# Patient Record
Sex: Female | Born: 1960 | Race: White | Hispanic: No | Marital: Married | Smoking: Never smoker
Health system: Southern US, Community
[De-identification: ages and names within clinical notes are randomized; demographics above are authoritative.]

---

## 1999-09-26 ENCOUNTER — Emergency Department (HOSPITAL_COMMUNITY): Admission: EM | Admit: 1999-09-26 | Discharge: 1999-09-26 | Payer: Self-pay | Admitting: Emergency Medicine

## 1999-09-26 ENCOUNTER — Encounter: Payer: Self-pay | Admitting: Emergency Medicine

## 2000-03-24 ENCOUNTER — Emergency Department (HOSPITAL_COMMUNITY): Admission: EM | Admit: 2000-03-24 | Discharge: 2000-03-24 | Payer: Self-pay | Admitting: *Deleted

## 2000-05-08 ENCOUNTER — Encounter: Payer: Self-pay | Admitting: Emergency Medicine

## 2000-05-08 ENCOUNTER — Emergency Department (HOSPITAL_COMMUNITY): Admission: EM | Admit: 2000-05-08 | Discharge: 2000-05-08 | Payer: Self-pay | Admitting: Emergency Medicine

## 2000-05-19 ENCOUNTER — Emergency Department (HOSPITAL_COMMUNITY): Admission: EM | Admit: 2000-05-19 | Discharge: 2000-05-19 | Payer: Self-pay | Admitting: Emergency Medicine

## 2000-05-19 ENCOUNTER — Encounter: Payer: Self-pay | Admitting: Emergency Medicine

## 2002-11-10 ENCOUNTER — Emergency Department (HOSPITAL_COMMUNITY): Admission: EM | Admit: 2002-11-10 | Discharge: 2002-11-10 | Payer: Self-pay | Admitting: Emergency Medicine

## 2004-07-09 ENCOUNTER — Emergency Department (HOSPITAL_COMMUNITY): Admission: EM | Admit: 2004-07-09 | Discharge: 2004-07-09 | Payer: Self-pay | Admitting: Emergency Medicine

## 2005-07-02 ENCOUNTER — Emergency Department (HOSPITAL_COMMUNITY): Admission: EM | Admit: 2005-07-02 | Discharge: 2005-07-02 | Payer: Self-pay | Admitting: Emergency Medicine

## 2007-05-20 ENCOUNTER — Emergency Department (HOSPITAL_COMMUNITY): Admission: EM | Admit: 2007-05-20 | Discharge: 2007-05-20 | Payer: Self-pay | Admitting: Emergency Medicine

## 2008-06-05 ENCOUNTER — Emergency Department (HOSPITAL_COMMUNITY): Admission: EM | Admit: 2008-06-05 | Discharge: 2008-06-06 | Payer: Self-pay | Admitting: Emergency Medicine

## 2009-03-13 ENCOUNTER — Emergency Department (HOSPITAL_COMMUNITY): Admission: EM | Admit: 2009-03-13 | Discharge: 2009-03-13 | Payer: Self-pay | Admitting: Emergency Medicine

## 2009-07-10 ENCOUNTER — Ambulatory Visit: Payer: Self-pay | Admitting: Cardiology

## 2009-07-10 ENCOUNTER — Ambulatory Visit: Payer: Self-pay | Admitting: Internal Medicine

## 2009-07-10 ENCOUNTER — Observation Stay (HOSPITAL_COMMUNITY): Admission: EM | Admit: 2009-07-10 | Discharge: 2009-07-11 | Payer: Self-pay | Admitting: Emergency Medicine

## 2009-07-25 ENCOUNTER — Encounter (INDEPENDENT_AMBULATORY_CARE_PROVIDER_SITE_OTHER): Payer: Self-pay | Admitting: Internal Medicine

## 2009-07-25 ENCOUNTER — Ambulatory Visit: Payer: Self-pay | Admitting: Internal Medicine

## 2009-07-25 DIAGNOSIS — R05 Cough: Secondary | ICD-10-CM

## 2009-07-25 DIAGNOSIS — D509 Iron deficiency anemia, unspecified: Secondary | ICD-10-CM | POA: Insufficient documentation

## 2009-07-25 DIAGNOSIS — R079 Chest pain, unspecified: Secondary | ICD-10-CM | POA: Insufficient documentation

## 2011-01-04 LAB — CARDIAC PANEL(CRET KIN+CKTOT+MB+TROPI)
CK, MB: 0.4 ng/mL (ref 0.3–4.0)
Troponin I: 0.01 ng/mL (ref 0.00–0.06)

## 2011-01-04 LAB — COMPREHENSIVE METABOLIC PANEL
ALT: 11 U/L (ref 0–35)
BUN: 4 mg/dL — ABNORMAL LOW (ref 6–23)
Calcium: 8.7 mg/dL (ref 8.4–10.5)
Creatinine, Ser: 0.7 mg/dL (ref 0.4–1.2)
GFR calc non Af Amer: >60 mL/min (ref >60)
Glucose, Bld: 105 mg/dL — ABNORMAL HIGH (ref 70–99)
Sodium: 138 mEq/L (ref 135–145)
Total Protein: 7.5 g/dL (ref 6.0–8.3)

## 2011-01-04 LAB — POCT I-STAT, CHEM 8
HCT: 33 % — ABNORMAL LOW (ref 36.0–46.0)
Hemoglobin: 11.2 g/dL — ABNORMAL LOW (ref 12.0–15.0)
Potassium: 4.1 mEq/L (ref 3.5–5.1)
Sodium: 138 mEq/L (ref 135–145)

## 2011-01-04 LAB — CBC
Hemoglobin: 9 g/dL — ABNORMAL LOW (ref 12.0–15.0)
MCHC: 32 g/dL (ref 30.0–36.0)
Platelets: 290 10*3/uL (ref 150–400)
RBC: 4.32 MIL/uL (ref 3.87–5.11)
RDW: 17.1 % — ABNORMAL HIGH (ref 11.5–15.5)
RDW: 17.3 % — ABNORMAL HIGH (ref 11.5–15.5)
WBC: 6.9 10*3/uL (ref 4.0–10.5)

## 2011-01-04 LAB — LIPID PANEL
LDL Cholesterol: 89 mg/dL (ref 0–99)
Triglycerides: 75 mg/dL (ref <150)

## 2011-01-04 LAB — URINALYSIS, ROUTINE W REFLEX MICROSCOPIC
Glucose, UA: NEGATIVE mg/dL
Nitrite: POSITIVE — AB
Protein, ur: NEGATIVE mg/dL
pH: 7 (ref 5.0–8.0)

## 2011-01-04 LAB — RETICULOCYTES
RBC.: 4.5 MIL/uL (ref 3.87–5.11)
Retic Count, Absolute: 54 10*3/uL (ref 19.0–186.0)
Retic Ct Pct: 1.2 % (ref 0.4–3.1)

## 2011-01-04 LAB — CK TOTAL AND CKMB (NOT AT ARMC)
Relative Index: INVALID (ref 0.0–2.5)
Total CK: 39 U/L (ref 7–177)

## 2011-01-04 LAB — POCT CARDIAC MARKERS
CKMB, poc: <1 ng/mL — ABNORMAL LOW (ref 1.0–8.0)
CKMB, poc: <1 ng/mL — ABNORMAL LOW (ref 1.0–8.0)
Myoglobin, poc: 46.4 ng/mL (ref 12–200)

## 2011-01-04 LAB — FERRITIN: Ferritin: 4 ng/mL — ABNORMAL LOW (ref 10–291)

## 2011-01-04 LAB — DIFFERENTIAL
Basophils Absolute: 0.1 10*3/uL (ref 0.0–0.1)
Basophils Relative: 1 % (ref 0–1)
Neutro Abs: 5.9 10*3/uL (ref 1.7–7.7)
Neutrophils Relative %: 71 % (ref 43–77)

## 2011-01-04 LAB — TSH: TSH: 0.949 u[IU]/mL (ref 0.350–4.500)

## 2011-01-04 LAB — BASIC METABOLIC PANEL
BUN: 4 mg/dL — ABNORMAL LOW (ref 6–23)
CO2: 25 mEq/L (ref 19–32)
Chloride: 104 mEq/L (ref 96–112)
Creatinine, Ser: 0.78 mg/dL (ref 0.4–1.2)
Potassium: 3.6 mEq/L (ref 3.5–5.1)

## 2011-01-04 LAB — RAPID URINE DRUG SCREEN, HOSP PERFORMED
Amphetamines: NOT DETECTED
Barbiturates: NOT DETECTED

## 2011-01-04 LAB — IRON AND TIBC
Iron: 17 ug/dL — ABNORMAL LOW (ref 42–135)
Saturation Ratios: 5 % — ABNORMAL LOW (ref 20–55)
TIBC: 347 ug/dL (ref 250–470)
UIBC: 330 ug/dL

## 2011-01-04 LAB — VITAMIN B12: Vitamin B-12: 395 pg/mL (ref 211–911)

## 2011-01-04 LAB — URINE MICROSCOPIC-ADD ON

## 2011-01-04 LAB — TROPONIN I: Troponin I: 0.01 ng/mL (ref 0.00–0.06)

## 2011-06-24 ENCOUNTER — Emergency Department (HOSPITAL_COMMUNITY)
Admission: EM | Admit: 2011-06-24 | Discharge: 2011-06-24 | Disposition: A | Discharge status: Home / Self Care | Payer: Self-pay | Attending: Emergency Medicine | Admitting: Emergency Medicine

## 2011-06-24 ENCOUNTER — Emergency Department (HOSPITAL_COMMUNITY): Payer: Self-pay

## 2011-06-24 DIAGNOSIS — R062 Wheezing: Secondary | ICD-10-CM | POA: Insufficient documentation

## 2011-06-24 DIAGNOSIS — R05 Cough: Secondary | ICD-10-CM | POA: Insufficient documentation

## 2011-06-24 DIAGNOSIS — R059 Cough, unspecified: Secondary | ICD-10-CM | POA: Insufficient documentation

## 2012-02-07 ENCOUNTER — Emergency Department (HOSPITAL_COMMUNITY)
Admission: EM | Admit: 2012-02-07 | Discharge: 2012-02-07 | Disposition: A | Discharge status: Home / Self Care | Payer: Self-pay | Attending: Emergency Medicine | Admitting: Emergency Medicine

## 2012-02-07 ENCOUNTER — Encounter (HOSPITAL_COMMUNITY): Payer: Self-pay | Admitting: Family Medicine

## 2012-02-07 DIAGNOSIS — M542 Cervicalgia: Secondary | ICD-10-CM | POA: Insufficient documentation

## 2012-02-07 DIAGNOSIS — M25519 Pain in unspecified shoulder: Secondary | ICD-10-CM | POA: Insufficient documentation

## 2012-02-07 DIAGNOSIS — M62838 Other muscle spasm: Secondary | ICD-10-CM | POA: Insufficient documentation

## 2012-02-07 MED ORDER — KETOROLAC TROMETHAMINE 30 MG/ML IJ SOLN
30.0000 mg | Freq: Once | INTRAMUSCULAR | Status: AC
  Administered 2012-02-07: 30 mg via INTRAMUSCULAR
  Filled 2012-02-07: qty 1

## 2012-02-07 MED ORDER — DIAZEPAM 2 MG PO TABS
2.0000 mg | ORAL_TABLET | Freq: Once | ORAL | Status: AC
  Administered 2012-02-07: 2 mg via ORAL
  Filled 2012-02-07: qty 1

## 2012-02-07 MED ORDER — IBUPROFEN 600 MG PO TABS: 600.0000 mg | ORAL_TABLET | Freq: Four times a day (QID) | ORAL | Status: AC | PRN

## 2012-02-07 MED ORDER — DIAZEPAM 2 MG PO TABS: 2.0000 mg | ORAL_TABLET | Freq: Three times a day (TID) | ORAL | Status: AC | PRN

## 2012-02-07 NOTE — ED Notes (Signed)
Patient states that she has had neck pain x 3 days. States pain is "getting sharper". States neck pain moves up her neck and gives her a headache. Denies injury.

## 2012-02-07 NOTE — ED Provider Notes (Signed)
History     CSN: [id number]  Arrival date & time 02/07/12  0130   First MD Initiated Contact with Patient 02/07/12 0355      Chief Complaint  Patient presents with  . Neck Pain    (Consider location/radiation/quality/duration/timing/severity/associated sxs/prior treatment) HPI Comments: Is a left-sided neck pain for the past 3 days.  She is taking Tylenol without relief.  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  Denies sleeping with a fan her having a draft on her neck  Patient is a 51 y.o. female presenting with neck pain. The history is provided by the patient.  Neck Pain  This is a new problem. The current episode started more than 2 days ago. The problem occurs constantly. The problem has not changed since onset.The pain is associated with nothing. There has been no fever. The pain is present in the left side. The quality of the pain is described as aching. The pain radiates to the left shoulder. The pain is at a severity of 4/10. The pain is moderate. The symptoms are aggravated by position. Pertinent negatives include no chest pain, no headaches and no weakness.    History reviewed. No pertinent past medical history.  History reviewed. No pertinent past surgical history.  No family history on file.  History  Substance Use Topics  . Smoking status: Never Smoker   . Smokeless tobacco: Not on file  . Alcohol Use: No    OB History    Grav Para Term Preterm Abortions TAB SAB Ect Mult Living                  Review of Systems  Constitutional: Negative for fever and chills.  HENT: Positive for neck pain. Negative for congestion and rhinorrhea.   Respiratory: Negative for cough and shortness of breath.   Cardiovascular: Negative for chest pain and palpitations.  Musculoskeletal: Negative for myalgias and back pain.  Skin: Negative for rash.  Neurological: Negative for dizziness, weakness and headaches.    Allergies  Review  of patient's allergies indicates no known allergies.  Home Medications   Current Outpatient Rx  Name Route Sig Dispense Refill  . ASPIRIN-CAFFEINE 845-65 MG PO PACK Oral Take by mouth.    Marland Kitchen DIAZEPAM 2 MG PO TABS Oral Take 1 tablet (2 mg total) by mouth every 8 (eight) hours as needed for anxiety. 20 tablet 0  . IBUPROFEN 600 MG PO TABS Oral Take 1 tablet (600 mg total) by mouth every 6 (six) hours as needed for pain. 30 tablet 0    BP 145/83  Pulse 84  Temp(Src) 97.9 F (36.6 C) (Oral)  Resp 18  SpO2 98%  Physical Exam  Constitutional: She is oriented to person, place, and time. She appears well-developed and well-nourished.  HENT:  Head: Normocephalic.  Right Ear: Tympanic membrane, external ear and ear canal normal.  Left Ear: Tympanic membrane, external ear and ear canal normal.  Neck:    Cardiovascular: Normal rate.   Pulmonary/Chest: Effort normal.  Musculoskeletal: She exhibits no edema and no tenderness.  Neurological: She is alert and oriented to person, place, and time.  Skin: Skin is warm.    ED Course  Procedures (including critical care time)  Labs Reviewed - No data to display No results found.   1. Muscle spasms of head or neck    Patient has significant relief of her discomfort after by mouth Valium IM Toradol and warm compresses  MDM  Muscle spasm We'll treat with low-dose Valium, heat, and ibuprofen/Toradol       Arman Filter, NP 02/07/12 1610  Arman Filter, NP 02/07/12 [phone]

## 2012-02-08 NOTE — ED Provider Notes (Signed)
Medical screening examination/treatment/procedure(s) were performed by non-physician practitioner and as supervising physician I was immediately available for consultation/collaboration.  Jalaine Riggenbach, MD 02/08/12 0127 

## 2012-02-25 ENCOUNTER — Emergency Department (HOSPITAL_COMMUNITY)
Admission: EM | Admit: 2012-02-25 | Discharge: 2012-02-25 | Disposition: A | Discharge status: Home / Self Care | Payer: Self-pay | Attending: Emergency Medicine | Admitting: Emergency Medicine

## 2012-02-25 ENCOUNTER — Encounter (HOSPITAL_COMMUNITY): Payer: Self-pay | Admitting: *Deleted

## 2012-02-25 DIAGNOSIS — M549 Dorsalgia, unspecified: Secondary | ICD-10-CM

## 2012-02-25 DIAGNOSIS — M545 Low back pain, unspecified: Secondary | ICD-10-CM | POA: Insufficient documentation

## 2012-02-25 LAB — URINALYSIS, ROUTINE W REFLEX MICROSCOPIC
Leukocytes, UA: NEGATIVE
Nitrite: NEGATIVE
Specific Gravity, Urine: 1.019 (ref 1.005–1.030)
pH: 7.5 (ref 5.0–8.0)

## 2012-02-25 LAB — POCT PREGNANCY, URINE: Preg Test, Ur: NEGATIVE

## 2012-02-25 MED ORDER — OXYCODONE-ACETAMINOPHEN 5-325 MG PO TABS: 1.0000 | ORAL_TABLET | ORAL | Status: AC | PRN

## 2012-02-25 MED ORDER — CYCLOBENZAPRINE HCL 10 MG PO TABS: 10.0000 mg | ORAL_TABLET | Freq: Two times a day (BID) | ORAL | Status: AC | PRN

## 2012-02-25 MED ORDER — SULFAMETHOXAZOLE-TRIMETHOPRIM 800-160 MG PO TABS: 1.0000 | ORAL_TABLET | Freq: Two times a day (BID) | ORAL | Status: DC

## 2012-02-25 MED ORDER — IBUPROFEN 800 MG PO TABS: 800.0000 mg | ORAL_TABLET | Freq: Three times a day (TID) | ORAL | Status: AC

## 2012-02-25 MED ORDER — IBUPROFEN 800 MG PO TABS
800.0000 mg | ORAL_TABLET | Freq: Once | ORAL | Status: AC
  Administered 2012-02-25: 800 mg via ORAL
  Filled 2012-02-25: qty 1

## 2012-02-25 NOTE — Discharge Instructions (Signed)
Courtney Howe you do not have a UTI today (the pain over your R kidney).  I think this is a skilled skeletal back pain. Take ibuprofen 800 mg every 6 hours with food x24 hours. You can take a muscle relaxer and Percocet for severe pain but do not drive with this medication. Return to the ER for bowel or bladder problems or if you develop severe pain and blood in your urine.  Follow up with a pcp from the list below.  RESOURCE GUIDE  Dental Problems  Patients with Medicaid: [facility] [phone] [address]                                           [phone] [address] Phone:  [phone]                                                  Phone:  [phone]  If unable to pay or uninsured, contact:  [facility] or [facility]. to become qualified for the adult dental clinic.  Chronic Pain Problems Contact [facility]  [phone] Patients need to be referred by their primary care doctor.  Insufficient Money for Medicine Contact [employer]:  call "211" or [facility] [phone].  No Primary Care Doctor Call [facility]  646-820-8306 Other agencies that provide inexpensive medical care    [facility]  [phone]    [facility]  [phone]    [facility]  [phone]    [facility]  [phone]    [facility]  [phone]    [facility]  4782799401  Psychological Services [facility] Behavioral [facility]  [phone] [facility] Services  [phone] [facility] Mental [facility]   [phone] (emergency services [phone])  Substance Abuse Resources [facility]  [phone] [facility] [phone] [facility] [phone] Floydene Flock [phone] Residential & Outpatient Substance Abuse Program  [phone]  Abuse/Neglect [facility] Child Abuse Hotline 778-237-4098 [facility] Child Abuse Hotline [phone] (After  Hours)  Emergency Shelter [facility] [phone]  Maternity Homes [facility] [phone] [facility] 9088687980  MRSA Hotline #:   204-128-4913    [facility] Resources  [facility]     [employer]                          [facility] Dept. [address] [place]                       [address]      [address]  Courtney Howe  Courtney Howe Phone:  [phone]                                   Phone:  [phone]                 Phone:  [phone]  [facility] Phone:  [phone]  [facility] Child Abuse Hotline 717-097-8637 670-550-1999 (After Hours)   Back Pain, Adult Back pain is very common. The pain often gets better over time. The cause of back pain is usually not dangerous. Most people can learn to manage their back pain on their own.  HOME CARE   Stay active. Start with short walks on flat ground if you can. Try to walk farther each day.   Do not sit, drive, or stand in one place for more than 30 minutes. Do not stay in bed.   Do not avoid exercise or work. Activity can help your back heal faster.   Be careful when you bend or lift an object. Bend at your knees, keep the object close to you, and do not twist.   Sleep on a firm mattress. Lie on your side, and bend your knees. If you lie on your back, put a pillow under your knees.   Only take medicines as told by your doctor.   Put ice on the injured area.   Put ice in a plastic bag.   Place a towel between your skin and the bag.   Leave the ice on for 15 to 20 minutes, 3 to 4 times a day for the first 2 to 3 days. After that, you can switch between ice and heat packs.   Ask your doctor about back exercises or massage.   Avoid feeling anxious or stressed. Find good ways to deal with stress, such as  exercise.  GET HELP RIGHT AWAY IF:   Your pain does not go away with rest or medicine.   Your pain does not go away in 1 week.   You have new problems.   You do not feel well.   The pain spreads into your legs.   You cannot control when you poop (bowel movement) or pee (urinate).   Your arms or legs feel weak or lose feeling (numbness).   You feel sick to your stomach (nauseous) or throw up (vomit).   You have belly (abdominal) pain.   You feel like you may pass out (faint).  MAKE SURE YOU:   Understand these instructions.   Will watch your condition.   Will get help right away if you are not doing well or get worse.  Document Released: 03/05/2008 Document Revised: 09/06/2011 Document Reviewed: 02/05/2011 [facility] Patient Information 2012 [place], [place].

## 2012-02-25 NOTE — ED Provider Notes (Signed)
History     CSN: [id number]  Arrival date & time 02/25/12  1103   First MD Initiated Contact with Patient 02/25/12 1138      Chief Complaint  Patient presents with  . Back Pain    (Consider location/radiation/quality/duration/timing/severity/associated sxs/prior treatment) Patient is a 51 y.o. female presenting with back pain. The history is provided by the patient. No language interpreter was used.  Back Pain  This is a new problem. The current episode started 2 days ago. The problem occurs constantly. The problem has been gradually worsening. The pain is associated with no known injury. The quality of the pain is described as aching. The pain does not radiate. The pain is at a severity of 9/10. The pain is moderate. The symptoms are aggravated by bending, twisting and certain positions. The pain is the same all the time. Pertinent negatives include no fever, no numbness, no headaches, no bowel incontinence, no perianal numbness, no bladder incontinence, no dysuria, no leg pain, no paresthesias, no paresis, no tingling and no weakness. She has tried NSAIDs for the symptoms. The treatment provided mild relief.   Patient here today complaining of right lower back pain since Saturday. 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 She denies injury. No radiation of pain to the right leg. No bowel or bladder problems. No fever. Patient has taken 2 doses of 600 of ibuprofen with an some relief. No nausea vomiting. Past medical history none.  History reviewed. No pertinent past medical history.  History reviewed. No pertinent past surgical history.  History reviewed. No pertinent family history.  History  Substance Use Topics  . Smoking status: Never Smoker   . Smokeless tobacco: Not on file  . Alcohol Use: Yes    OB History    Grav Para Term Preterm Abortions TAB SAB Ect Mult Living                  Review of Systems   Constitutional: Negative.  Negative for fever.  HENT: Negative.   Eyes: Negative.   Respiratory: Negative.   Cardiovascular: Negative.   Gastrointestinal: Negative.  Negative for bowel incontinence.  Genitourinary: Negative for bladder incontinence and dysuria.  Musculoskeletal: Positive for back pain. Negative for gait problem.  Neurological: Negative.  Negative for dizziness, tingling, weakness, numbness, headaches and paresthesias.  Psychiatric/Behavioral: Negative.   All other systems reviewed and are negative.    Allergies  Review of patient's allergies indicates no known allergies.  Home Medications   Current Outpatient Rx  Name Route Sig Dispense Refill  . IBUPROFEN 600 MG PO TABS Oral Take 600 mg by mouth every 6 (six) hours as needed. For pain/fever      BP 136/69  Pulse 65  Temp(Src) 97.7 F (36.5 C) (Oral)  Resp 18  SpO2 97%  Physical Exam  Nursing note and vitals reviewed. Constitutional: She is oriented to person, place, and time. She appears well-developed and well-nourished.  HENT:  Head: Normocephalic and atraumatic.  Eyes: Conjunctivae and EOM are normal. Pupils are equal, round, and reactive to light.  Neck: Normal range of motion. Neck supple.  Cardiovascular: Normal rate.   Pulmonary/Chest: Effort normal.  Abdominal: Soft.  Musculoskeletal: Normal range of motion. She exhibits tenderness. She exhibits no edema.       R Lower back  Neurological: She is alert and oriented to person, place, and time. She has  normal reflexes.  Skin: Skin is warm and dry.  Psychiatric: She has a normal mood and affect.    ED Course  Procedures (including critical care time)   Labs Reviewed  URINALYSIS, ROUTINE W REFLEX MICROSCOPIC   No results found.   No diagnosis found.    MDM  R lower back pain with no radiation.  No bowel or bladder problems no fever.  Treated with ibuprofen in the ER.  Rx for flexoril.  Instructed to use ice as well.  Follow up with  pcp from list.  Return if worse.        Remi Haggard, NP 02/25/12 2122

## 2012-02-25 NOTE — ED Provider Notes (Signed)
Medical screening examination/treatment/procedure(s) were performed by non-physician practitioner and as supervising physician I was immediately available for consultation/collaboration.   Gwyneth Sprout, MD 02/25/12 [phone]

## 2012-02-25 NOTE — ED Notes (Signed)
C/o lower back pain onset Sat. Denies injury. States the pain is getting worse.

## 2012-07-19 ENCOUNTER — Emergency Department (HOSPITAL_COMMUNITY)
Admission: EM | Admit: 2012-07-19 | Discharge: 2012-07-20 | Disposition: A | Discharge status: Home / Self Care | Payer: Self-pay | Attending: Emergency Medicine | Admitting: Emergency Medicine

## 2012-07-19 ENCOUNTER — Encounter (HOSPITAL_COMMUNITY): Payer: Self-pay

## 2012-07-19 DIAGNOSIS — M25561 Pain in right knee: Secondary | ICD-10-CM

## 2012-07-19 DIAGNOSIS — R609 Edema, unspecified: Secondary | ICD-10-CM | POA: Insufficient documentation

## 2012-07-19 DIAGNOSIS — M25569 Pain in unspecified knee: Secondary | ICD-10-CM | POA: Insufficient documentation

## 2012-07-19 NOTE — ED Notes (Signed)
pt reports (R) knee pain and swelling x2 weeks, pt reports increase pain w/movement, pt denies injury

## 2012-07-20 ENCOUNTER — Emergency Department (HOSPITAL_COMMUNITY): Payer: Self-pay

## 2012-07-20 MED ORDER — HYDROCODONE-ACETAMINOPHEN 5-325 MG PO TABS: 1.0000 | ORAL_TABLET | ORAL | Status: DC | PRN

## 2012-07-20 NOTE — ED Notes (Signed)
Patient currently resting quietly in bed; no respiratory or acute distress noted.  Patient updated on plan of care; informed patient that we are currently waiting on further orders from EDP/PA.  Patient has no other questions or concerns at this time; will continue to monitor. 

## 2012-07-20 NOTE — ED Notes (Signed)
Patient complaining of right knee pain and swelling that started two weeks ago.  Rates pain 8/10 on the numerical pain scale; describes pain as a "sorness".  Pain worsens upon standing and ambulating.  Patient denies any recent injury to right knee. Ambulatory from triage to room. Patient alert and oriented x4; PERRL present.  Will continue to monitor.

## 2012-07-20 NOTE — ED Notes (Signed)
Paged ortho for knee immobilizer and crutches (patient states that she is 5'3").  Ortho is on way.

## 2012-07-20 NOTE — ED Notes (Signed)
Patient currently resting quietly in bed; no respiratory or acute distress noted. Patient updated on plan of care; informed patient that we are currently waiting for ortho to come and apply knee immobilizer and give patient crutches.  Denies any needs at this time; will continue to monitor.
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
## 2012-07-20 NOTE — ED Provider Notes (Signed)
Medical screening examination/treatment/procedure(s) were performed by non-physician practitioner and as supervising physician I was immediately available for consultation/collaboration.   Trenise Turay, MD 07/20/12 0753 

## 2012-07-20 NOTE — ED Notes (Signed)
Patient given discharge instructions; went over discharge paperwork with patient.  Patient instructed to take prescriptions as directed, to not drive or drink while taking pain medication, to follow up with referral, and to return to the ED for new, worsening, or concerning symptoms.

## 2014-06-23 ENCOUNTER — Encounter (HOSPITAL_COMMUNITY): Payer: Self-pay | Admitting: Emergency Medicine

## 2014-06-23 ENCOUNTER — Emergency Department (HOSPITAL_COMMUNITY)
Admission: EM | Admit: 2014-06-23 | Discharge: 2014-06-24 | Disposition: A | Discharge status: Home / Self Care | Payer: Self-pay | Attending: Emergency Medicine | Admitting: Emergency Medicine

## 2014-06-23 DIAGNOSIS — Z3202 Encounter for pregnancy test, result negative: Secondary | ICD-10-CM | POA: Insufficient documentation

## 2014-06-23 DIAGNOSIS — R109 Unspecified abdominal pain: Secondary | ICD-10-CM | POA: Insufficient documentation

## 2014-06-23 DIAGNOSIS — N390 Urinary tract infection, site not specified: Secondary | ICD-10-CM | POA: Insufficient documentation

## 2014-06-23 DIAGNOSIS — R1031 Right lower quadrant pain: Secondary | ICD-10-CM | POA: Insufficient documentation

## 2014-06-23 LAB — BASIC METABOLIC PANEL
Anion gap: 15 (ref 5–15)
BUN: 9 mg/dL (ref 6–23)
CHLORIDE: 95 meq/L — AB (ref 96–112)
CO2: 24 mEq/L (ref 19–32)
CREATININE: 0.74 mg/dL (ref 0.50–1.10)
Calcium: 9.4 mg/dL (ref 8.4–10.5)
GFR calc Af Amer: >90 mL/min (ref >90)
GFR calc non Af Amer: >90 mL/min (ref >90)
GLUCOSE: 111 mg/dL — AB (ref 70–99)
POTASSIUM: 4.2 meq/L (ref 3.7–5.3)
Sodium: 134 mEq/L — ABNORMAL LOW (ref 137–147)

## 2014-06-23 LAB — CBC WITH DIFFERENTIAL/PLATELET
BASOS PCT: 0 % (ref 0–1)
Basophils Absolute: 0 10*3/uL (ref 0.0–0.1)
EOS ABS: 0.1 10*3/uL (ref 0.0–0.7)
Eosinophils Relative: 0 % (ref 0–5)
HEMATOCRIT: 41.9 % (ref 36.0–46.0)
HEMOGLOBIN: 14.6 g/dL (ref 12.0–15.0)
LYMPHS ABS: 2.5 10*3/uL (ref 0.7–4.0)
Lymphocytes Relative: 17 % (ref 12–46)
MCH: 31.8 pg (ref 26.0–34.0)
MCHC: 34.8 g/dL (ref 30.0–36.0)
MCV: 91.3 fL (ref 78.0–100.0)
MONO ABS: 0.9 10*3/uL (ref 0.1–1.0)
MONOS PCT: 6 % (ref 3–12)
NEUTROS ABS: 11.9 10*3/uL — AB (ref 1.7–7.7)
NEUTROS PCT: 77 % (ref 43–77)
Platelets: 301 10*3/uL (ref 150–400)
RBC: 4.59 MIL/uL (ref 3.87–5.11)
RDW: 12.3 % (ref 11.5–15.5)
WBC: 15.4 10*3/uL — ABNORMAL HIGH (ref 4.0–10.5)

## 2014-06-23 LAB — URINALYSIS, ROUTINE W REFLEX MICROSCOPIC
BILIRUBIN URINE: NEGATIVE
GLUCOSE, UA: NEGATIVE mg/dL
KETONES UR: NEGATIVE mg/dL
NITRITE: POSITIVE — AB
PH: 5.5 (ref 5.0–8.0)
Protein, ur: 30 mg/dL — AB
SPECIFIC GRAVITY, URINE: 1.013 (ref 1.005–1.030)
Urobilinogen, UA: 1 mg/dL (ref 0.0–1.0)

## 2014-06-23 LAB — URINE MICROSCOPIC-ADD ON

## 2014-06-23 MED ORDER — CEFTRIAXONE SODIUM 1 G IJ SOLR
1.0000 g | Freq: Once | INTRAMUSCULAR | Status: AC
  Administered 2014-06-23: 1 g via INTRAVENOUS
  Filled 2014-06-23: qty 10

## 2014-06-23 MED ORDER — KETOROLAC TROMETHAMINE 30 MG/ML IJ SOLN
30.0000 mg | Freq: Once | INTRAMUSCULAR | Status: AC
  Administered 2014-06-23: 30 mg via INTRAVENOUS
  Filled 2014-06-23: qty 1

## 2014-06-23 MED ORDER — SODIUM CHLORIDE 0.9 % IV SOLN
INTRAVENOUS | Status: DC
  Administered 2014-06-23 – 2014-06-24 (×2): via INTRAVENOUS

## 2014-06-23 NOTE — ED Provider Notes (Signed)
CSN: [id number]     Arrival date & time 06/23/14  2043 History   First MD Initiated Contact with Patient 06/23/14 2155     Chief Complaint  Patient presents with  . Flank Pain     (Consider location/radiation/quality/duration/timing/severity/associated sxs/prior Treatment) HPI Patient reports yesterday she started urinating small amounts frequently with some dysuria. She states today she started having lower abdominal discomfort and low back pain with some right-sided flank pain. She denies nausea, vomiting, or fevers. She denies hematuria. She states urinating, walking, and standing up straight makes the pain worse, nothing makes it feel better. She states she had similar symptoms when she had a UTI 20 years ago after the birth of her son. She denies any vaginal discharge. She states she gets a sharp shooting pain that only lasted a few seconds in her lower abdomen. Her last normal period was 3 years ago. She has never had any abdominal surgeries in the past.  PCP none  History reviewed. No pertinent past medical history. History reviewed. No pertinent past surgical history. Family History  Problem Relation Age of Onset  . Cancer Father    History  Substance Use Topics  . Smoking status: Never Smoker   . Smokeless tobacco: Not on file  . Alcohol Use: Yes     Comment: occ   Employed as a cook  OB History   Grav Para Term Preterm Abortions TAB SAB Ect Mult Living                 Review of Systems  All other systems reviewed and are negative.     Allergies  Review of patient's allergies indicates no known allergies.  Home Medications   Prior to Admission medications   Medication Sig Start Date End Date Taking? Authorizing Provider  acetaminophen (TYLENOL) 325 MG tablet Take 650 mg by mouth every 6 (six) hours as needed. For headache pain or fever   Yes Historical Provider, MD   BP 151/94  Pulse 93  Temp(Src) 98.6 F (37 C) (Oral)  Resp 18  Ht  (1.6 m)  Wt  150 lb (68.04 kg)  BMI 26.58 kg/m2  SpO2 99%  Vital signs normal   Physical Exam  Nursing note and vitals reviewed. Constitutional: She is oriented to person, place, and time. She appears well-developed and well-nourished.  Non-toxic appearance. She does not appear ill. No distress.  HENT:  Head: Normocephalic and atraumatic.  Right Ear: External ear normal.  Left Ear: External ear normal.  Nose: Nose normal. No mucosal edema or rhinorrhea.  Mouth/Throat: Oropharynx is clear and moist and mucous membranes are normal. No dental abscesses or uvula swelling.  Eyes: Conjunctivae and EOM are normal. Pupils are equal, round, and reactive to light.  Neck: Normal range of motion and full passive range of motion without pain. Neck supple.  Cardiovascular: Normal rate, regular rhythm and normal heart sounds.  Exam reveals no gallop and no friction rub.   No murmur heard. Pulmonary/Chest: Effort normal and breath sounds normal. No respiratory distress. She has no wheezes. She has no rhonchi. She has no rales. She exhibits no tenderness and no crepitus.  Abdominal: Soft. Normal appearance and bowel sounds are normal. She exhibits no distension. There is tenderness. There is no rebound and no guarding.    Musculoskeletal: Normal range of motion. She exhibits no edema and no tenderness.       Back:  Moves all extremities well. Patient has some right CVA tenderness  Neurological: She is alert and oriented to person, place, and time. She has normal strength. No cranial nerve deficit.  Skin: Skin is warm, dry and intact. No rash noted. No erythema. No pallor.  Psychiatric: She has a normal mood and affect. Her speech is normal and behavior is normal. Her mood appears not anxious.    ED Course  Procedures (including critical care time)  Medications  0.9 %  sodium chloride infusion ( Intravenous New Bag/Given 06/23/14 2239)  ketorolac (TORADOL) 30 MG/ML injection 30 mg (30 mg Intravenous Given  06/23/14 2239)  cefTRIAXone (ROCEPHIN) 1 g in dextrose 5 % 50 mL IVPB (0 g Intravenous Stopped 06/23/14 2331)   PT was started on IV rocephin for her UTI  Recheck at 2330 patient states her pain is improved. However she still very tender to palpation in her right lower quadrant and along her right upper abdomen. At this point we discussed getting CT abdomen to look for pyelonephritis, atypical appendicitis and she is agreeable.  2400 patient turned over to Dr. Romeo Apple to get her CT report. Hopefully she has a urinary tract infection and can be discharged home with antibiotics.  Labs Review Results for orders placed during the hospital encounter of 06/23/14  URINALYSIS, ROUTINE W REFLEX MICROSCOPIC      Result Value Ref Range   Color, Urine YELLOW  YELLOW   APPearance TURBID (*) CLEAR   Specific Gravity, Urine 1.013  1.005 - 1.030   pH 5.5  5.0 - 8.0   Glucose, UA NEGATIVE  NEGATIVE mg/dL   Hgb urine dipstick MODERATE (*) NEGATIVE   Bilirubin Urine NEGATIVE  NEGATIVE   Ketones, ur NEGATIVE  NEGATIVE mg/dL   Protein, ur 30 (*) NEGATIVE mg/dL   Urobilinogen, UA 1.0  0.0 - 1.0 mg/dL   Nitrite POSITIVE (*) NEGATIVE   Leukocytes, UA LARGE (*) NEGATIVE  CBC WITH DIFFERENTIAL      Result Value Ref Range   WBC 15.4 (*) 4.0 - 10.5 K/uL   RBC 4.59  3.87 - 5.11 MIL/uL   Hemoglobin 14.6  12.0 - 15.0 g/dL   HCT 45.4  09.8 - 11.9 %   MCV 91.3  78.0 - 100.0 fL   MCH 31.8  26.0 - 34.0 pg   MCHC 34.8  30.0 - 36.0 g/dL   RDW 14.7  82.9 - 56.2 %   Platelets 301  150 - 400 K/uL   Neutrophils Relative % 77  43 - 77 %   Neutro Abs 11.9 (*) 1.7 - 7.7 K/uL   Lymphocytes Relative 17  12 - 46 %   Lymphs Abs 2.5  0.7 - 4.0 K/uL   Monocytes Relative 6  3 - 12 %   Monocytes Absolute 0.9  0.1 - 1.0 K/uL   Eosinophils Relative 0  0 - 5 %   Eosinophils Absolute 0.1  0.0 - 0.7 K/uL   Basophils Relative 0  0 - 1 %   Basophils Absolute 0.0  0.0 - 0.1 K/uL  BASIC METABOLIC PANEL      Result Value Ref Range     Sodium 134 (*) 137 - 147 mEq/L   Potassium 4.2  3.7 - 5.3 mEq/L   Chloride 95 (*) 96 - 112 mEq/L   CO2 24  19 - 32 mEq/L   Glucose, Bld 111 (*) 70 - 99 mg/dL   BUN 9  6 - 23 mg/dL   Creatinine, Ser 1.30  0.50 - 1.10 mg/dL   Calcium 9.4  8.4 - 10.5 mg/dL   GFR calc non Af Amer >90  >90 mL/min   GFR calc Af Amer >90  >90 mL/min   Anion gap 15  5 - 15  URINE MICROSCOPIC-ADD ON      Result Value Ref Range   Squamous Epithelial / LPF RARE  RARE   WBC, UA TOO NUMEROUS TO COUNT  <3 WBC/hpf   RBC / HPF 3-6  <3 RBC/hpf   Bacteria, UA FEW (*) RARE   Laboratory interpretation all normal except UTI, leukocytosis     Imaging Review No results found.   EKG Interpretation None      MDM   Final diagnoses:  Urinary tract infection without hematuria, site unspecified  RLQ abdominal pain    Disposition pending   Courtney Albe, MD, Armando Gang     Ward Givens, MD 06/23/14 [phone]

## 2014-06-23 NOTE — ED Notes (Signed)
Pt states she is having pain in her right lower quadrant and the right side of her lower back  Pt states pain started today  Pt states she voids she has a lot of pressure and some burning  Pt states she is only voiding small amts at a time

## 2014-06-23 NOTE — ED Notes (Signed)
Urine Culture requisition sent to lab, lab informed us there was not enough urine for the culture. Will recollect specimen.

## 2014-06-24 ENCOUNTER — Emergency Department (HOSPITAL_COMMUNITY): Payer: Self-pay

## 2014-06-24 ENCOUNTER — Encounter (HOSPITAL_COMMUNITY): Payer: Self-pay

## 2014-06-24 LAB — PREGNANCY, URINE: PREG TEST UR: NEGATIVE

## 2014-06-24 MED ORDER — CIPROFLOXACIN HCL 500 MG PO TABS: 500.0000 mg | ORAL_TABLET | Freq: Two times a day (BID) | ORAL | Status: DC

## 2014-06-24 MED ORDER — IOHEXOL 300 MG/ML  SOLN
50.0000 mL | Freq: Once | INTRAMUSCULAR | Status: AC | PRN
  Administered 2014-06-24: 50 mL via ORAL

## 2014-06-24 MED ORDER — IOHEXOL 300 MG/ML  SOLN
100.0000 mL | Freq: Once | INTRAMUSCULAR | Status: AC | PRN
  Administered 2014-06-24: 100 mL via INTRAVENOUS

## 2014-06-24 MED ORDER — ONDANSETRON 4 MG PO TBDP: ORAL_TABLET | ORAL | Status: DC

## 2014-06-24 MED ORDER — OXYCODONE-ACETAMINOPHEN 5-325 MG PO TABS: 1.0000 | ORAL_TABLET | Freq: Four times a day (QID) | ORAL | Status: DC | PRN

## 2014-06-24 NOTE — ED Provider Notes (Signed)
12:07 AM Accepted care from Dr. Lynelle Doctor. 36F here w/ flank pain. Found to have UTI. CT ordered to r/o underlying appy or acute surgical process.   4:17 AM: CT w/ cystitis and ureteritis. Will send home on cipro. Urine cx has been sent. Pt continues to appear well, no vomiting, minimal pain.  I have discussed the diagnosis/risks/treatment options with the patient and believe the pt to be eligible for discharge home to follow-up with her pcp. We also discussed returning to the ED immediately if new or worsening sx occur. We discussed the sx which are most concerning (e.g., worsening pain, inability to take abx, inc vomiting) that necessitate immediate return. Medications administered to the patient during their visit and any new prescriptions provided to the patient are listed below.  Medications given during this visit Medications  0.9 %  sodium chloride infusion ( Intravenous New Bag/Given 06/24/14 0310)  ketorolac (TORADOL) 30 MG/ML injection 30 mg (30 mg Intravenous Given 06/23/14 2239)  cefTRIAXone (ROCEPHIN) 1 g in dextrose 5 % 50 mL IVPB (0 g Intravenous Stopped 06/23/14 2331)  iohexol (OMNIPAQUE) 300 MG/ML solution 50 mL (50 mLs Oral Contrast Given 06/24/14 0044)  iohexol (OMNIPAQUE) 300 MG/ML solution 100 mL (100 mLs Intravenous Contrast Given 06/24/14 0220)    New Prescriptions   CIPROFLOXACIN (CIPRO) 500 MG TABLET    Take 1 tablet (500 mg total) by mouth 2 (two) times daily. One po bid x 7 days   ONDANSETRON (ZOFRAN ODT) 4 MG DISINTEGRATING TABLET     ODT q4 hours prn nausea/vomit   OXYCODONE-ACETAMINOPHEN (PERCOCET) 5-325 MG PER TABLET    Take 1-2 tablets by mouth every 6 (six) hours as needed for moderate pain.   Clinical Impression 1. Urinary tract infection without hematuria, site unspecified   2. RLQ abdominal pain      Purvis Sheffield, MD 06/24/14 1732

## 2014-06-25 LAB — URINE CULTURE
COLONY COUNT: NO GROWTH
CULTURE: NO GROWTH
Special Requests: NORMAL

## 2015-06-08 ENCOUNTER — Emergency Department (HOSPITAL_COMMUNITY)
Admission: EM | Admit: 2015-06-08 | Discharge: 2015-06-08 | Disposition: A | Discharge status: Home / Self Care | Payer: Self-pay | Attending: Emergency Medicine | Admitting: Emergency Medicine

## 2015-06-08 ENCOUNTER — Encounter (HOSPITAL_COMMUNITY): Payer: Self-pay | Admitting: Family Medicine

## 2015-06-08 DIAGNOSIS — G5603 Carpal tunnel syndrome, bilateral upper limbs: Secondary | ICD-10-CM

## 2015-06-08 DIAGNOSIS — G5601 Carpal tunnel syndrome, right upper limb: Secondary | ICD-10-CM | POA: Insufficient documentation

## 2015-06-08 DIAGNOSIS — G5602 Carpal tunnel syndrome, left upper limb: Secondary | ICD-10-CM | POA: Insufficient documentation

## 2015-06-08 DIAGNOSIS — Z79899 Other long term (current) drug therapy: Secondary | ICD-10-CM | POA: Insufficient documentation

## 2015-06-08 DIAGNOSIS — Z792 Long term (current) use of antibiotics: Secondary | ICD-10-CM | POA: Insufficient documentation

## 2015-06-08 NOTE — Discharge Instructions (Signed)
Carpal Tunnel Syndrome The carpal tunnel is a narrow area located on the palm side of your wrist. The tunnel is formed by the wrist bones and ligaments. Nerves, blood vessels, and tendons pass through the carpal tunnel. Repeated wrist motion or certain diseases may cause swelling within the tunnel. This swelling pinches the main nerve in the wrist (median nerve) and causes the painful hand and arm condition called carpal tunnel syndrome. CAUSES   Repeated wrist motions.  Wrist injuries.  Certain diseases like arthritis, diabetes, alcoholism, hyperthyroidism, and kidney failure.  Obesity.  Pregnancy. SYMPTOMS   A "pins and needles" feeling in your fingers or hand, especially in your thumb, index and middle fingers.  Tingling or numbness in your fingers or hand.  An aching feeling in your entire arm, especially when your wrist and elbow are bent for long periods of time.  Wrist pain that goes up your arm to your shoulder.  Pain that goes down into your palm or fingers.  A weak feeling in your hands. DIAGNOSIS  Your health care provider will take your history and perform a physical exam. An electromyography test may be needed. This test measures electrical signals sent out by your nerves into the muscles. The electrical signals are usually slowed by carpal tunnel syndrome. You may also need X-rays. TREATMENT  Carpal tunnel syndrome may clear up by itself. Your health care provider may recommend a wrist splint or medicine such as a nonsteroidal anti-inflammatory medicine. Cortisone injections may help. Sometimes, surgery may be needed to free the pinched nerve.  HOME CARE INSTRUCTIONS   Take all medicine as directed by your health care provider. Only take over-the-counter or prescription medicines for pain, discomfort, or fever as directed by your health care provider.  If you were given a splint to keep your wrist from bending, wear it as directed. It is important to wear the splint at  night. Wear the splint for as long as you have pain or numbness in your hand, arm, or wrist. This may take 1 to 2 months.  Rest your wrist from any activity that may be causing your pain. If your symptoms are work-related, you may need to talk to your employer about changing to a job that does not require using your wrist.  Put ice on your wrist after long periods of wrist activity.  Put ice in a plastic bag.  Place a towel between your skin and the bag.  Leave the ice on for 15-20 minutes, 03-04 times a day.  Keep all follow-up visits as directed by your health care provider. This includes any orthopedic referrals, physical therapy, and rehabilitation. Any delay in getting necessary care could result in a delay or failure of your condition to heal. SEEK IMMEDIATE MEDICAL CARE IF:   You have new, unexplained symptoms.  Your symptoms get worse and are not helped or controlled with medicines. MAKE SURE YOU:   Understand these instructions.  Will watch your condition.  Will get help right away if you are not doing well or get worse. Document Released: 09/14/2000 Document Revised: 02/01/2014 Document Reviewed: 08/03/2011 [facility] Patient Information 2015 [place], [place]. This information is not intended to replace advice given to you by your health care provider. Make sure you discuss any questions you have with your health care provider.  Please read attached information, please use splints as directed, please follow-up with orthopedic specialist if symptoms continue to persist or worsen.

## 2015-06-08 NOTE — ED Notes (Signed)
Pt here for pain in right hand/wrist pain and numbness in fingers. 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 sts also same pain in left thumb area

## 2015-06-08 NOTE — ED Notes (Signed)
Declined W/C at D/C and was escorted to lobby by RN. 

## 2015-06-08 NOTE — ED Provider Notes (Signed)
CSN: [id number]     Arrival date & time 06/08/15  1009 History  This chart was scribed for non-physician practitioner, Eyvonne Mechanic, PA-C working with Azalia Bilis, MD by Gwenyth Ober, ED scribe. This patient was seen in room TR05C/TR05C and the patient's care was started at 10:33 AM   Chief Complaint  Patient presents with  . Hand Pain  . Numbness   The history is provided by the patient. No language interpreter was used.   HPI Comments: Courtney Howe is a 54 y.o. female who presents to the Emergency Department complaining of constant, gradually worsening, numbness and tingling of her right hand, fingers, wrist and forearm that started several months ago and became worse over the last week. She states a similar tingling sensation on her left thumb as an associated symptom. She reports at times she is asymptomatic; symptoms become worse at night and with bending her wrist. Pt has tried Ibuprofen with no relief. 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 She denies fever, chills and recent infections or traumas.   History reviewed. No pertinent past medical history. History reviewed. No pertinent past surgical history. Family History  Problem Relation Age of Onset  . Cancer Father    Social History  Substance Use Topics  . Smoking status: Never Smoker   . Smokeless tobacco: None  . Alcohol Use: Yes     Comment: occ   OB History    No data available     Review of Systems  All other systems reviewed and are negative.   Allergies  Review of patient's allergies indicates no known allergies.  Home Medications   Prior to Admission medications   Medication Sig Start Date End Date Taking? Authorizing Provider  acetaminophen (TYLENOL) 325 MG tablet Take 650 mg by mouth every 6 (six) hours as needed. For headache pain or fever    Historical Provider, MD  ciprofloxacin (CIPRO) 500 MG tablet Take 1 tablet (500 mg total) by mouth 2 (two) times daily. One po bid x 7 days 06/24/14   Purvis Sheffield, MD   ondansetron (ZOFRAN ODT) 4 MG disintegrating tablet  ODT q4 hours prn nausea/vomit 06/24/14   Purvis Sheffield, MD  oxyCODONE-acetaminophen (PERCOCET) 5-325 MG per tablet Take 1-2 tablets by mouth every 6 (six) hours as needed for moderate pain. 06/24/14   Purvis Sheffield, MD   BP 133/81 mmHg  Pulse 65  Temp(Src) 97.8 F (36.6 C) (Oral)  Resp 16  Ht  (1.6 m)  Wt 173 lb (78.472 kg)  BMI 30.65 kg/m2  SpO2 99% Physical Exam  Constitutional: She appears well-developed and well-nourished. No distress.  HENT:  Head: Normocephalic and atraumatic.  Eyes: Conjunctivae and EOM are normal.  Neck: Neck supple. No tracheal deviation present.  Cardiovascular: Normal rate.   Pulses:      Radial pulses are 2+ on the right side, and 2+ on the left side.  Pulmonary/Chest: Effort normal. No respiratory distress.  Musculoskeletal:  TTP to the volar aspect of left and right wrist over medial nerve Phalen's positive Reduced sensation to distal aspect of fingers 1-5 on the right Cap refill less than 3 s Grip strength 5/5 Right hand: Decreased sensation to tip of thumb, remainder of digits sensation grossly intact Non-tender to palpation to remainder of upper extremities Full active ROM of hand, wrist, elbow and shoulder No cervical and upper thoracic spinal TTP, pain with ROM or obvious signs of infection or trauma   Skin: Skin is warm and dry.  Psychiatric:  She has a normal mood and affect. Her behavior is normal.  Nursing note and vitals reviewed.   ED Course  Procedures   DIAGNOSTIC STUDIES: Oxygen Saturation is 100% on RA, normal by my interpretation.    COORDINATION OF CARE: 10:44 AM Discussed treatment plan with pt which includes Ibuprofen and supportive splints. Will refer to orthopedist, if needed. Pt agreed to plan.   Labs Review Labs Reviewed - No data to display  Imaging Review No results found.   EKG Interpretation None      MDM   Final diagnoses:   Bilateral carpal tunnel syndrome   Labs:    Imaging:   Consults:   Therapeutics: Wrist splints  Discharge Meds:   Assessment/Plan: Patient's presentation most consistent with carpal tunnel, no signs of compartment syndrome, no significant neurological deficit. Patient instructed to use ibuprofen or Tylenol as needed for pain, limit use of hands and wrists, use splints, follow-up with her primary care provider for reevaluation, follow-up with orthopedist if symptoms continue to persist or worsen.    I personally performed the services described in this documentation, which was scribed in my presence. The recorded information has been reviewed and is accurate.    Eyvonne Mechanic, PA-C 06/09/15 1653  Azalia Bilis, MD 06/11/15 [phone]

## 2016-08-18 ENCOUNTER — Encounter (HOSPITAL_COMMUNITY): Payer: Self-pay | Admitting: Emergency Medicine

## 2016-08-18 ENCOUNTER — Emergency Department (HOSPITAL_COMMUNITY): Payer: Self-pay

## 2016-08-18 DIAGNOSIS — Y939 Activity, unspecified: Secondary | ICD-10-CM | POA: Insufficient documentation

## 2016-08-18 DIAGNOSIS — S52611A Displaced fracture of right ulna styloid process, initial encounter for closed fracture: Secondary | ICD-10-CM | POA: Insufficient documentation

## 2016-08-18 DIAGNOSIS — S52511A Displaced fracture of right radial styloid process, initial encounter for closed fracture: Secondary | ICD-10-CM | POA: Insufficient documentation

## 2016-08-18 DIAGNOSIS — Y999 Unspecified external cause status: Secondary | ICD-10-CM | POA: Insufficient documentation

## 2016-08-18 DIAGNOSIS — W109XXA Fall (on) (from) unspecified stairs and steps, initial encounter: Secondary | ICD-10-CM | POA: Insufficient documentation

## 2016-08-18 DIAGNOSIS — Y92009 Unspecified place in unspecified non-institutional (private) residence as the place of occurrence of the external cause: Secondary | ICD-10-CM | POA: Insufficient documentation

## 2016-08-18 NOTE — ED Triage Notes (Signed)
Pt. lost her balance and fell from stairs at friend's house this evening , reports pain at right wrist , denies LOC/ambulatory .

## 2016-08-19 ENCOUNTER — Emergency Department (HOSPITAL_COMMUNITY)
Admission: EM | Admit: 2016-08-19 | Discharge: 2016-08-19 | Disposition: A | Discharge status: Home / Self Care | Payer: Self-pay | Attending: Emergency Medicine | Admitting: Emergency Medicine

## 2016-08-19 DIAGNOSIS — S52511A Displaced fracture of right radial styloid process, initial encounter for closed fracture: Secondary | ICD-10-CM

## 2016-08-19 DIAGNOSIS — S52611A Displaced fracture of right ulna styloid process, initial encounter for closed fracture: Secondary | ICD-10-CM

## 2016-08-19 MED ORDER — HYDROCODONE-ACETAMINOPHEN 5-325 MG PO TABS
2.0000 | ORAL_TABLET | Freq: Once | ORAL | Status: AC
  Administered 2016-08-19: 2 via ORAL
  Filled 2016-08-19: qty 2

## 2016-08-19 MED ORDER — HYDROCODONE-ACETAMINOPHEN 5-325 MG PO TABS: 2.0000 | ORAL_TABLET | ORAL | 0 refills | Status: DC | PRN

## 2016-08-19 NOTE — ED Provider Notes (Signed)
[facility]-EMERGENCY DEPT Provider Note   CSN: [id number] Arrival date & time: 08/18/16  2145     History   Chief Complaint Chief Complaint  Patient presents with  . Fall  . Wrist Pain    HPI Jake ChurchJean Y Schnackenberg is a 55 y.o. female.  HPI   Patient is a 55 year old female with history of iron deficiency anemia, left hand dominant, presents the emergency department with right wrist pain that occurred after falling at roughly 9 PM this evening. Pt experienced FOOSH onto her bilateral hands. Patient's right wrist pain is constant, radiates up her forearm, worse with movement, has not tried anything, 10/10. Mild associated swelling. No other associated symptoms. Patient denies numbness/timing, weakness in her fingers, hitting her head, loss consciousness.  History reviewed. No pertinent past medical history.  Patient Active Problem List   Diagnosis Date Noted  . ANEMIA-IRON DEFICIENCY 07/25/2009  . COUGH 07/25/2009  . CHEST PAIN 07/25/2009    History reviewed. No pertinent surgical history.  OB History    No data available       Home Medications    Prior to Admission medications   Medication Sig Start Date End Date Taking? Authorizing Provider  acetaminophen (TYLENOL) 325 MG tablet Take 650 mg by mouth every 6 (six) hours as needed. For headache pain or fever    Historical Provider, MD  ciprofloxacin (CIPRO) 500 MG tablet Take 1 tablet (500 mg total) by mouth 2 (two) times daily. One po bid x 7 days 06/24/14   Purvis SheffieldForrest Harrison, MD  ondansetron (ZOFRAN ODT) 4 MG disintegrating tablet 4mg  ODT q4 hours prn nausea/vomit 06/24/14   Purvis SheffieldForrest Harrison, MD  oxyCODONE-acetaminophen (PERCOCET) 5-325 MG per tablet Take 1-2 tablets by mouth every 6 (six) hours as needed for moderate pain. 06/24/14   Purvis SheffieldForrest Harrison, MD    Family History Family History  Problem Relation Age of Onset  . Cancer Father     Social History Social History  Substance Use Topics  . Smoking status: Never Smoker  .  Smokeless tobacco: Never Used  . Alcohol use Yes     Comment: occ     Allergies   Patient has no known allergies.   Review of Systems Review of Systems  Musculoskeletal: Positive for arthralgias and joint swelling.  Skin: Negative for wound.  Neurological: Negative for syncope and headaches.     Physical Exam Updated Vital Signs BP 137/87 (BP Location: Left Arm)   Pulse 78   Temp 97.7 F (36.5 C) (Oral)   Resp 16   Ht 5\' 3"  (1.6 m)   Wt 76.2 kg   SpO2 99%   BMI 29.76 kg/m   Physical Exam  Constitutional: She appears well-developed and well-nourished. No distress.  HENT:  Head: Normocephalic and atraumatic.  Eyes: Conjunctivae are normal.  Pulmonary/Chest: Effort normal. No respiratory distress.  Musculoskeletal: Normal range of motion.  Examination of right wrist revealed no obvious deformity, mild edema to the lateral aspect of wrist with associated TTP, full range of motion of the elbow without TTP of the elbow or proximal forearm, full range motion of the phalanges, sensation intact, 2+ radial pulses bilaterally. Patient is neurovascularly intact distally.  Neurological: She is alert. Coordination normal.  Skin: Skin is warm and dry. She is not diaphoretic.  Psychiatric: She has a normal mood and affect. Her behavior is normal.  Nursing note and vitals reviewed.    ED Treatments / Results  Labs (all labs ordered are listed, but only abnormal results  are displayed) Labs Reviewed - No data to display  EKG  EKG Interpretation None       Radiology Dg Wrist Complete Right  Result Date: 08/18/2016 CLINICAL DATA:  Status post fall, with right wrist injury. Posterior right wrist pain and tenderness. Initial encounter. EXAM: RIGHT WRIST - COMPLETE 3+ VIEW COMPARISON:  None. FINDINGS: There is a minimally displaced ulnar styloid fracture, with overlying soft tissue swelling. There also appears to be a minimally displaced radial styloid fracture, difficult to  fully characterize. The carpal rows are intact, and demonstrate normal alignment. The joint spaces are preserved. Dorsal soft tissue swelling is noted at the wrist. IMPRESSION: Minimally displaced ulnar styloid fracture, and apparent minimally displaced radial styloid fracture. Electronically Signed   By: Roanna RaiderJeffery  Chang M.D.   On: 08/18/2016 22:54    Procedures Procedures (including critical care time)  Medications Ordered in ED Medications  HYDROcodone-acetaminophen (NORCO/VICODIN) 5-325 MG per tablet 2 tablet (2 tablets Oral Given 08/19/16 0137)     Initial Impression / Assessment and Plan / ED Course  I have reviewed the triage vital signs and the nursing notes.  Pertinent labs & imaging results that were available during my care of the patient were reviewed by me and considered in my medical decision making (see chart for details).  Clinical Course    Patient with minimally displaced right ulnar styloid fracture and apparent minimally displaced radial styloid fracture. X-rays reviewed by me revealed no other deformities or dislocations or fractures. Patient was placed in a sugar tong splint and discussed RICE with patient. Norco for pain. Instructed patient to take stool softener or MiraLAX to prevent constipation. Instructed patient to follow-up with Dr. Mina MarbleWeingold on Monday for appointment next week. Discussed strict return precautions the ED. Patient is understanding to the discharge instructions.  Pt case discussed with Dr. Manus Gunningancour who agrees with the above plan.  Final Clinical Impressions(s) / ED Diagnoses   Final diagnoses:  Closed displaced fracture of styloid process of right ulna, initial encounter  Closed displaced fracture of styloid process of right radius, initial encounter    New Prescriptions New Prescriptions   No medications on file     Jerre SimonJessica L Adriyanna Christians, PA 08/19/16 0154    Glynn OctaveStephen Rancour, MD 08/19/16 [phone]

## 2016-08-19 NOTE — Discharge Instructions (Signed)
Take the Norco as needed for pain. Take a stool softener and/or MiraLAX to prevent constipation. Follow-up with Dr. Mina MarbleWeingold on Monday to schedule an appointment for next week to be seen. Elevate and ice your wrist as often as you can. Return to the emergency department with worsening symptoms or any new concerning symptoms.

## 2016-08-19 NOTE — Progress Notes (Signed)
Orthopedic Tech Progress Note Patient Details:  Courtney ChurchJean Y Howe May 24, 1961 [id number]  Ortho Devices Type of Ortho Device: Arm sling, Sugartong splint Ortho Device/Splint Location: rue Ortho Device/Splint Interventions: Ordered, Application   Trinna PostMartinez, Jed Kutch J 08/19/2016, 1:41 AM

## 2020-04-27 ENCOUNTER — Emergency Department (HOSPITAL_COMMUNITY): Payer: Self-pay

## 2020-04-27 ENCOUNTER — Emergency Department (HOSPITAL_COMMUNITY)
Admission: EM | Admit: 2020-04-27 | Discharge: 2020-04-28 | Disposition: A | Discharge status: Home / Self Care | Payer: Self-pay | Attending: Emergency Medicine | Admitting: Emergency Medicine

## 2020-04-27 ENCOUNTER — Other Ambulatory Visit: Payer: Self-pay

## 2020-04-27 ENCOUNTER — Encounter (HOSPITAL_COMMUNITY): Payer: Self-pay

## 2020-04-27 DIAGNOSIS — M25562 Pain in left knee: Secondary | ICD-10-CM | POA: Insufficient documentation

## 2020-04-27 DIAGNOSIS — Z5321 Procedure and treatment not carried out due to patient leaving prior to being seen by health care provider: Secondary | ICD-10-CM | POA: Insufficient documentation

## 2020-04-27 NOTE — ED Triage Notes (Signed)
Pt reports last Saturday she was getting out of her truck and twisted her left knee, c.o pain ever since. Pt ambulatory. No significant swelling noted.

## 2020-04-28 ENCOUNTER — Emergency Department (HOSPITAL_COMMUNITY)
Admission: EM | Admit: 2020-04-28 | Discharge: 2020-04-28 | Disposition: A | Discharge status: Home / Self Care | Payer: Self-pay | Attending: Emergency Medicine | Admitting: Emergency Medicine

## 2020-04-28 DIAGNOSIS — Y9389 Activity, other specified: Secondary | ICD-10-CM | POA: Insufficient documentation

## 2020-04-28 DIAGNOSIS — Y9289 Other specified places as the place of occurrence of the external cause: Secondary | ICD-10-CM | POA: Insufficient documentation

## 2020-04-28 DIAGNOSIS — M2540 Effusion, unspecified joint: Secondary | ICD-10-CM | POA: Insufficient documentation

## 2020-04-28 DIAGNOSIS — X58XXXA Exposure to other specified factors, initial encounter: Secondary | ICD-10-CM | POA: Insufficient documentation

## 2020-04-28 DIAGNOSIS — S8392XA Sprain of unspecified site of left knee, initial encounter: Secondary | ICD-10-CM | POA: Insufficient documentation

## 2020-04-28 DIAGNOSIS — Y998 Other external cause status: Secondary | ICD-10-CM | POA: Insufficient documentation

## 2020-04-28 MED ORDER — IBUPROFEN 600 MG PO TABS: 600.0000 mg | ORAL_TABLET | Freq: Four times a day (QID) | ORAL | 0 refills | Status: DC | PRN

## 2020-04-28 NOTE — Progress Notes (Signed)
Orthopedic Tech Progress Note Patient Details:  Courtney Howe [birth year] [id number]  Ortho Devices Type of Ortho Device: Knee Sleeve Ortho Device/Splint Location: LLE Ortho Device/Splint Interventions: Ordered, Adjustment   Post Interventions Patient Tolerated: Well Instructions Provided: Care of device, Adjustment of device, Poper ambulation with device   Remas Sobel 04/28/2020, 12:57 PM

## 2020-04-28 NOTE — ED Triage Notes (Signed)
Pt here for continued pain in L knee after getting out of a truck and twisting it on Saturday. Had negative x ray here last night but LWBS. Taking ibuprofen without relief.

## 2020-04-28 NOTE — ED Notes (Signed)
Pt states she is leaving  

## 2020-04-28 NOTE — ED Provider Notes (Signed)
[facility] EMERGENCY DEPARTMENT Provider Note   CSN: [id number] Arrival date & time: 04/28/20  1638     History Chief Complaint  Patient presents with   Knee Pain    Courtney Howe is a 59 y.o. female who presents with L knee pain. She states that she stepped out of her husbands truck on Saturday and twisted it. Since then she's been having L knee pain, swelling, stiffness over the medial side. She denies prior injury to this knee. She works as a Financial risk analyst and has not been able to rest and elevate it. She's been taking OTC Ibuprofen/Tylenol without significant relief. She states she's been using a "torn up" knee brace without relief as well. Sometimes it feels like her knee is going to give out on her. She is requesting a work note.   HPI     No past medical history on file.  Patient Active Problem List   Diagnosis Date Noted   ANEMIA-IRON DEFICIENCY 07/25/2009   COUGH 07/25/2009   CHEST PAIN 07/25/2009    No past surgical history on file.   OB History   No obstetric history on file.     Family History  Problem Relation Age of Onset   Cancer Father     Social History   Tobacco Use   Smoking status: Never Smoker   Smokeless tobacco: Never Used  Substance Use Topics   Alcohol use: Yes    Comment: occ   Drug use: No    Home Medications Prior to Admission medications   Medication Sig Start Date End Date Taking? Authorizing Provider  acetaminophen (TYLENOL) 325 MG tablet Take 650 mg by mouth every 6 (six) hours as needed. For headache pain or fever    [provider]  ciprofloxacin (CIPRO) 500 MG tablet Take 1 tablet (500 mg total) by mouth 2 (two) times daily. One po bid x 7 days 06/24/14   Purvis Sheffield, MD  HYDROcodone-acetaminophen (NORCO/VICODIN) 5-325 MG tablet Take 2 tablets by mouth every 4 (four) hours as needed. 08/19/16   Jerre Simon, PA  ondansetron (ZOFRAN ODT) 4 MG disintegrating tablet 4mg  ODT q4 hours prn  nausea/vomit 06/24/14   06/26/14, MD  oxyCODONE-acetaminophen (PERCOCET) 5-325 MG per tablet Take 1-2 tablets by mouth every 6 (six) hours as needed for moderate pain. 06/24/14   06/26/14, MD    Allergies    Patient has no known allergies.  Review of Systems   Review of Systems  Musculoskeletal: Positive for arthralgias and joint swelling.  Neurological: Negative for weakness and numbness.    Physical Exam Updated Vital Signs BP (!) 131/88 (BP Location: Right Arm)    Pulse 70    Temp 98.2 F (36.8 C) (Oral)    Resp 15    Ht 5\' 3"  (1.6 m)    Wt 77.1 kg    SpO2 100%    BMI 30.11 kg/m   Physical Exam Vitals and nursing note reviewed.  Constitutional:      General: She is not in acute distress.    Appearance: Normal appearance. She is well-developed. She is not ill-appearing.  HENT:     Head: Normocephalic and atraumatic.  Eyes:     General: No scleral icterus.       Right eye: No discharge.        Left eye: No discharge.     Conjunctiva/sclera: Conjunctivae normal.     Pupils: Pupils are equal, round, and reactive to light.  Cardiovascular:     Rate and Rhythm: Normal rate.  Pulmonary:     Effort: Pulmonary effort is normal. No respiratory distress.  Abdominal:     General: There is no distension.  Musculoskeletal:     Cervical back: Normal range of motion.     Comments: Left knee: Minimal swelling. Tenderness over medial joint line. Negative AP drawer and Varus/valgus stress. Decreased ROM secondary to pain and swelling. No redness. N/V intact  Skin:    General: Skin is warm and dry.  Neurological:     Mental Status: She is alert and oriented to person, place, and time.  Psychiatric:        Behavior: Behavior normal.     ED Results / Procedures / Treatments   Labs (all labs ordered are listed, but only abnormal results are displayed) Labs Reviewed - No data to display  EKG None  Radiology DG Knee Complete 4 Views Left  Result Date:  04/27/2020 CLINICAL DATA:  Left knee pain after injury. EXAM: LEFT KNEE - COMPLETE 4+ VIEW COMPARISON:  None. FINDINGS: No fracture or dislocation. Trace peripheral spurring. Joint spaces are preserved. Small joint effusion. No erosions or periosteal reaction. No focal soft tissue abnormality. IMPRESSION: No acute fracture or dislocation. Small joint effusion. Electronically Signed   By: Narda Rutherford M.D.   On: 04/27/2020 21:05    Procedures Procedures (including critical care time)  Medications Ordered in ED Medications - No data to display  ED Course  I have reviewed the triage vital signs and the nursing notes.  Pertinent labs & imaging results that were available during my care of the patient were reviewed by me and considered in my medical decision making (see chart for details).  59 year old female presents with swelling and painful L knee after twisting injury 5 days ago. There is a minimal joint effusion on her xray. She has not been able to rest and elevate her knee due to her job. Recommend RICE and rx for Ibuprofen given. Work note given. She was advised to f/u with ortho if symptoms are not improving.  MDM Rules/Calculators/A&P                           Final Clinical Impression(s) / ED Diagnoses Final diagnoses:  Sprain of left knee, unspecified ligament, initial encounter    Rx / DC Orders ED Discharge Orders    None       Bethel Born, PA-C 04/28/20 1311    Milagros Loll, MD 04/29/20 [phone]

## 2020-04-28 NOTE — Discharge Instructions (Addendum)
Rest - please stay off left knee as much as possible Ice - ice for 20 minutes at a time, several times a day Compression - wear brace to provide support Elevate - elevate left leg above level of heart Ibuprofen - take with food. Take up to 3-4 times daily

## 2020-09-26 ENCOUNTER — Emergency Department (HOSPITAL_COMMUNITY): Payer: Self-pay

## 2020-09-26 ENCOUNTER — Emergency Department (HOSPITAL_COMMUNITY)
Admission: EM | Admit: 2020-09-26 | Discharge: 2020-09-26 | Disposition: A | Discharge status: Home / Self Care | Payer: Self-pay | Attending: Emergency Medicine | Admitting: Emergency Medicine

## 2020-09-26 ENCOUNTER — Other Ambulatory Visit: Payer: Self-pay

## 2020-09-26 ENCOUNTER — Encounter (HOSPITAL_COMMUNITY): Payer: Self-pay

## 2020-09-26 DIAGNOSIS — S52572A Other intraarticular fracture of lower end of left radius, initial encounter for closed fracture: Secondary | ICD-10-CM | POA: Insufficient documentation

## 2020-09-26 DIAGNOSIS — W050XXA Fall from non-moving wheelchair, initial encounter: Secondary | ICD-10-CM | POA: Insufficient documentation

## 2020-09-26 MED ORDER — HYDROCODONE-ACETAMINOPHEN 5-325 MG PO TABS: 2.0000 | ORAL_TABLET | Freq: Four times a day (QID) | ORAL | 0 refills | Status: AC | PRN

## 2020-09-26 NOTE — ED Notes (Addendum)
patient moved to peds, awake alert, color pink,chest clear,good aeration,no retractions, 3 plus pulses,<2sec refill, patient with swelling to left hand,not using it,good pulses left wrist, has rings she cant get off, provider at bedside, patient here by herself

## 2020-09-26 NOTE — ED Notes (Addendum)
Patient awake alert, color pink,chest clear,good aeration,no retractions 3 plus pulses,2sec refill,patient with splint and sling intact, fingers pink, warm and mobile, ambulatory to wr after avs reviewed
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
## 2020-09-26 NOTE — Progress Notes (Signed)
Orthopedic Tech Progress Note Patient Details:  Courtney Howe 12-08-1960 [id number]  Ortho Devices Type of Ortho Device: Sugartong splint,Arm sling Ortho Device/Splint Location: LEU Ortho Device/Splint Interventions: Ordered,Application,Adjustment   Post Interventions Patient Tolerated: Well Instructions Provided: Care of device   Donald Pore 09/26/2020, 10:02 AM
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
## 2021-09-14 IMAGING — CR DG HAND COMPLETE 3+V*L*
3 series · 3 of 3 positions shown · non-contrast
Comparison: None.

CLINICAL DATA: Pain following fall

EXAM:
LEFT HAND - COMPLETE 3+ VIEW

[hand pa]
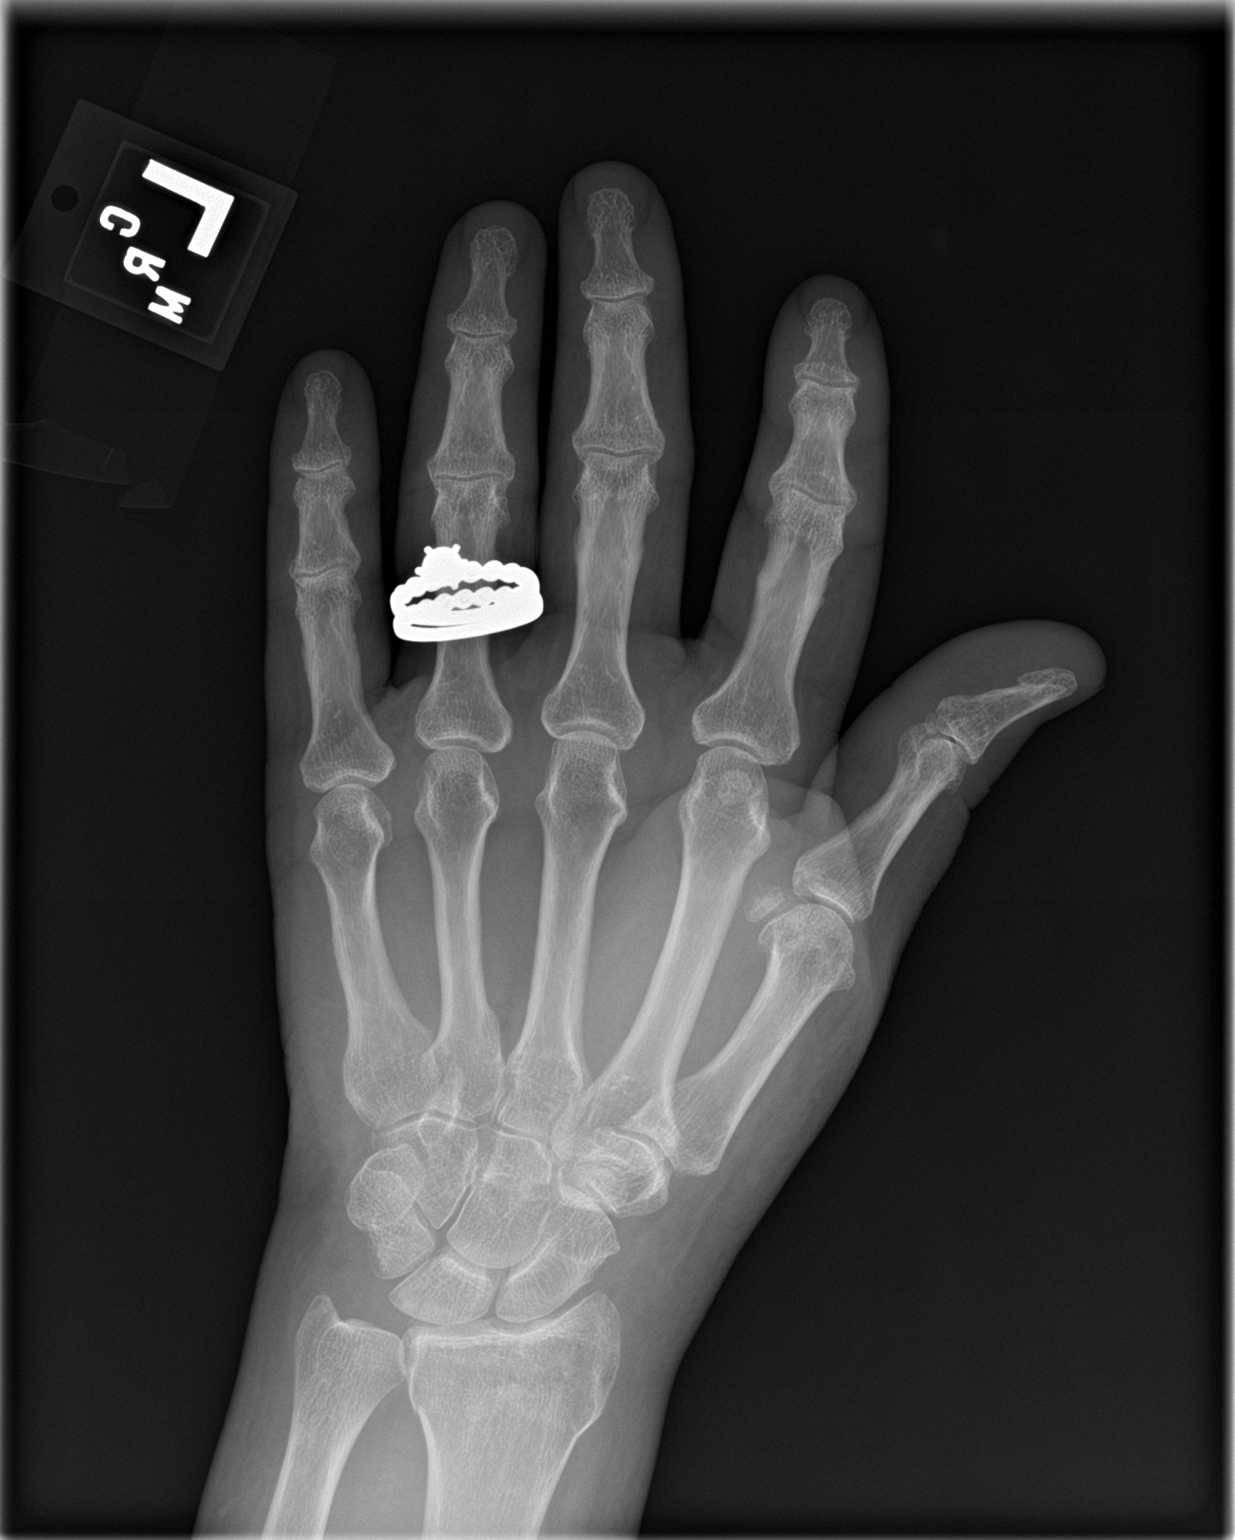

[hand obl]
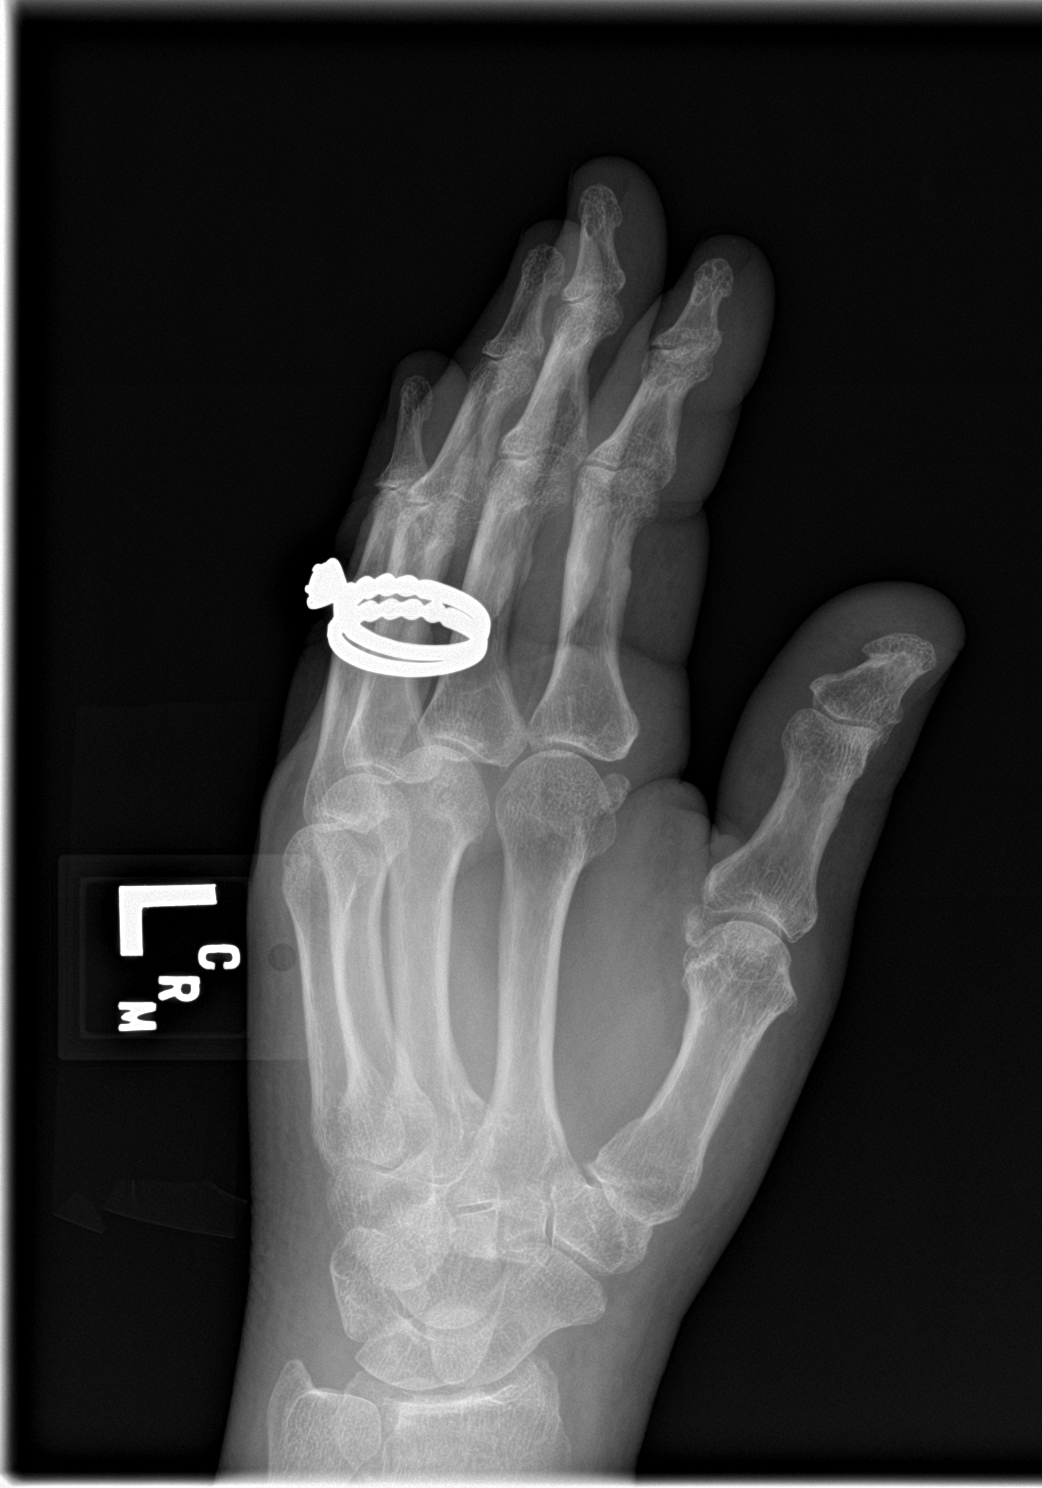

[hand lat]
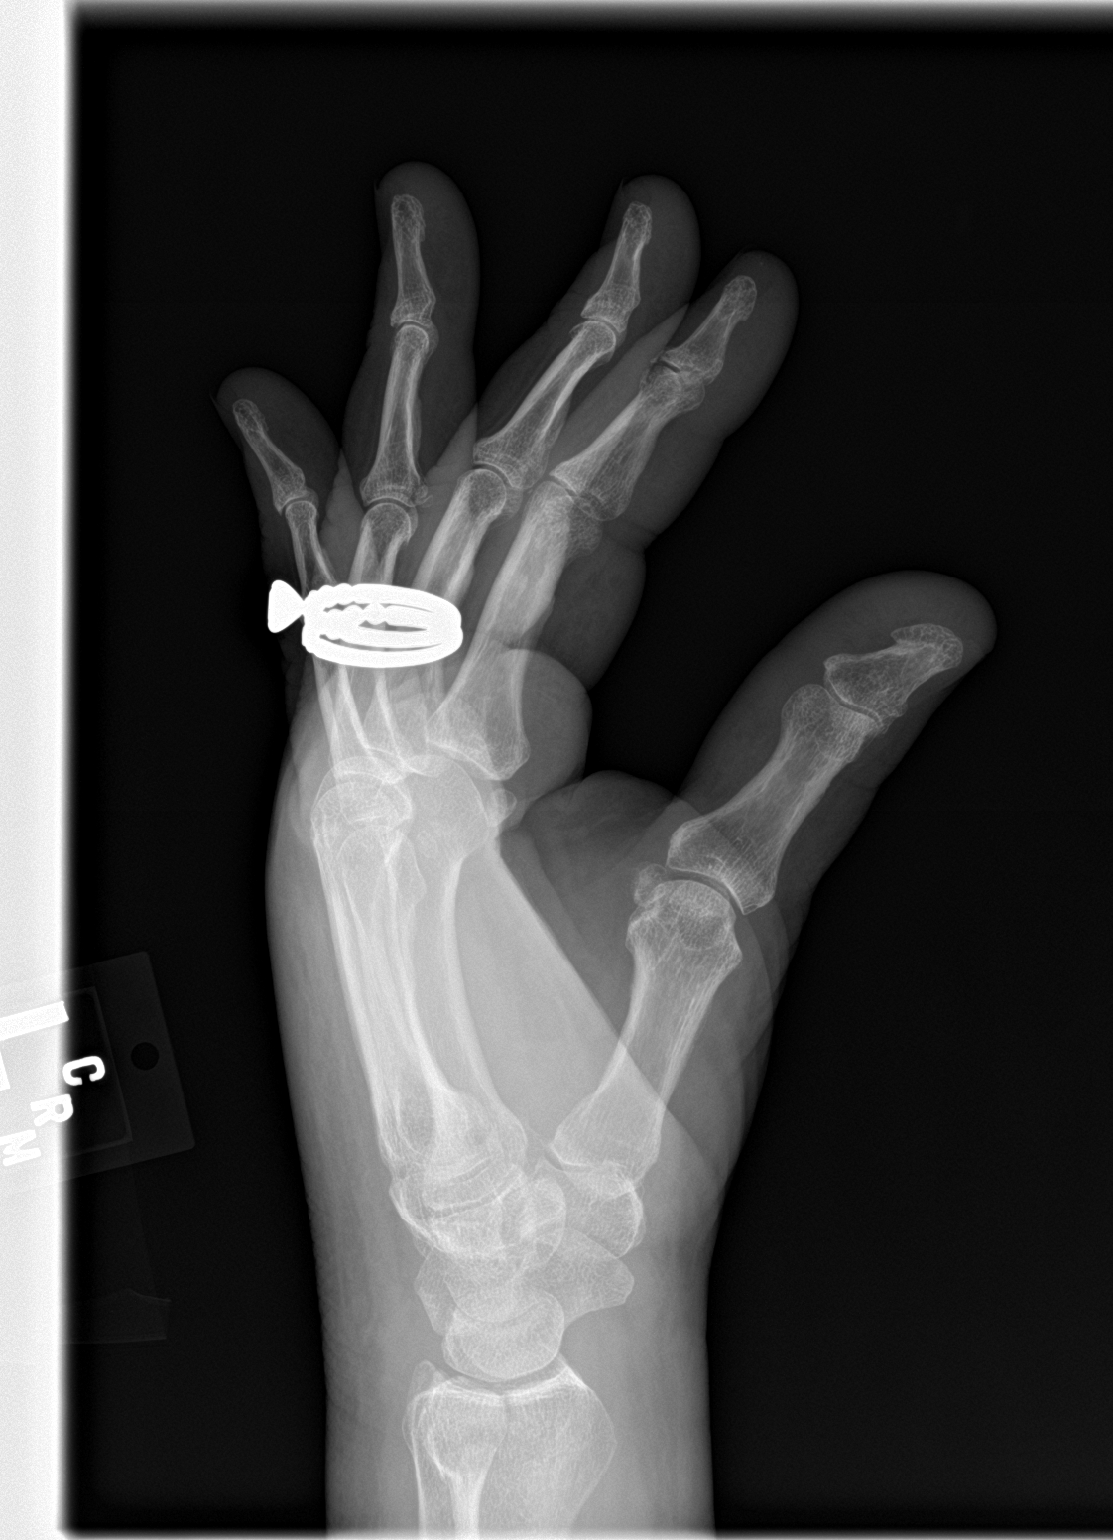

[3 of 3 positions shown; findings below may reference images not displayed]

FINDINGS: Frontal, oblique, and lateral views were obtained. There is a
nondisplaced transversely oriented fracture of the distal radial
metaphysis. No other fracture. No dislocation. The joint spaces
appear normal. No erosive change.
IMPRESSION: Nondisplaced fracture distal radial metaphysis. No other fracture.
No dislocation. No arthropathy.

## 2023-07-30 ENCOUNTER — Encounter (HOSPITAL_COMMUNITY): Payer: Self-pay

## 2023-07-30 ENCOUNTER — Other Ambulatory Visit: Payer: Self-pay

## 2023-07-30 ENCOUNTER — Emergency Department (HOSPITAL_COMMUNITY)
Admission: EM | Admit: 2023-07-30 | Discharge: 2023-07-30 | Disposition: A | Discharge status: Home / Self Care | Payer: 59 | Attending: Emergency Medicine | Admitting: Emergency Medicine

## 2023-07-30 ENCOUNTER — Emergency Department (HOSPITAL_COMMUNITY): Payer: 59

## 2023-07-30 DIAGNOSIS — I493 Ventricular premature depolarization: Secondary | ICD-10-CM | POA: Insufficient documentation

## 2023-07-30 DIAGNOSIS — R55 Syncope and collapse: Secondary | ICD-10-CM | POA: Diagnosis not present

## 2023-07-30 DIAGNOSIS — R002 Palpitations: Secondary | ICD-10-CM | POA: Diagnosis present

## 2023-07-30 LAB — COMPREHENSIVE METABOLIC PANEL
ALT: 16 U/L (ref 0–44)
AST: 17 U/L (ref 15–41)
Albumin: 3.8 g/dL (ref 3.5–5.0)
Alkaline Phosphatase: 65 U/L (ref 38–126)
Anion gap: 9 (ref 5–15)
BUN: 5 mg/dL — ABNORMAL LOW (ref 8–23)
CO2: 26 mmol/L (ref 22–32)
Calcium: 9.3 mg/dL (ref 8.9–10.3)
Chloride: 102 mmol/L (ref 98–111)
Creatinine, Ser: 0.71 mg/dL (ref 0.44–1.00)
GFR, Estimated: >60 mL/min (ref >60)
Glucose, Bld: 101 mg/dL — ABNORMAL HIGH (ref 70–99)
Potassium: 4.1 mmol/L (ref 3.5–5.1)
Sodium: 137 mmol/L (ref 135–145)
Total Bilirubin: 0.5 mg/dL (ref 0.3–1.2)
Total Protein: 7.7 g/dL (ref 6.5–8.1)

## 2023-07-30 LAB — CBC WITH DIFFERENTIAL/PLATELET
Abs Immature Granulocytes: 0.03 10*3/uL (ref 0.00–0.07)
Basophils Absolute: 0.1 10*3/uL (ref 0.0–0.1)
Basophils Relative: 1 %
Eosinophils Absolute: 0.3 10*3/uL (ref 0.0–0.5)
Eosinophils Relative: 2 %
HCT: 38.6 % (ref 36.0–46.0)
Hemoglobin: 13 g/dL (ref 12.0–15.0)
Immature Granulocytes: 0 %
Lymphocytes Relative: 26 %
Lymphs Abs: 2.7 10*3/uL (ref 0.7–4.0)
MCH: 29.9 pg (ref 26.0–34.0)
MCHC: 33.7 g/dL (ref 30.0–36.0)
MCV: 88.7 fL (ref 80.0–100.0)
Monocytes Absolute: 0.7 10*3/uL (ref 0.1–1.0)
Monocytes Relative: 7 %
Neutro Abs: 6.9 10*3/uL (ref 1.7–7.7)
Neutrophils Relative %: 64 %
Platelets: 516 10*3/uL — ABNORMAL HIGH (ref 150–400)
RBC: 4.35 MIL/uL (ref 3.87–5.11)
RDW: 12.6 % (ref 11.5–15.5)
WBC: 10.7 10*3/uL — ABNORMAL HIGH (ref 4.0–10.5)
nRBC: 0 % (ref 0.0–0.2)

## 2023-07-30 LAB — T4, FREE: Free T4: 0.97 ng/dL (ref 0.61–1.12)

## 2023-07-30 LAB — TROPONIN I (HIGH SENSITIVITY)
Troponin I (High Sensitivity): 4 ng/L (ref <18)
Troponin I (High Sensitivity): 4 ng/L (ref <18)

## 2023-07-30 LAB — TSH: TSH: 1.336 u[IU]/mL (ref 0.350–4.500)

## 2023-07-30 NOTE — Discharge Instructions (Addendum)
Please get plenty of rest and drink plenty of fluids over the next few days.  Please follow-up with cardiology and primary care doctor for further evaluation of symptoms preceding your presentation to the ER today. Avoid stimulants, tobacco use, and alcohol use. Return for any worsening or worrisome symptoms.   It was a pleasure caring for you today in the emergency department.  Please return to the emergency department for any worsening or worrisome symptoms.

## 2023-07-30 NOTE — ED Notes (Signed)
Walked patient to the bathroom patient did well 

## 2023-07-30 NOTE — ED Notes (Signed)
Got patient into a gown on the monitor did EKG shown to Dr Wallace Cullens patient has call bell in reach

## 2023-07-30 NOTE — ED Provider Notes (Signed)
[place] [facility]GENC[facility]MENT AT [facility] Provider Note  CSN: [id number] Arrival date & time: 07/30/23 3716  Chief Complaint(s) Dizziness and Palpitations  HPI Courtney Howe is a 62 y.o. female with past medical history as below, significant for IDA who presents to the ED with complaint of dizziness, palpitations  Patient reports she was at, around 5 AM she was working in the kitchen, felt she had palpitations, felt lightheaded, hands were tingling.  She sat down symptoms transiently improved, she got up to resume activity and symptoms reoccurred.  Felt lightheaded, room spinning sensation, palpitations, unsure if they were regular versus irregular but felt her heart was beating very quickly.  Associate with chest pain, nausea or vomiting or diaphoresis.  No syncope.  Symptoms have since resolved.  Denies similar symptoms in the past but does not follow cardiology.  No recent diet or medication change, no illicit drug use or alcohol use.  Felt as though she was in her normal state of health prior to onset of the symptoms  Past Medical History History reviewed. No pertinent past medical history. 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 Authorizing Provider  ciprofloxacin (CIPRO) 500 MG tablet Take 1 tablet (500 mg total) by mouth 2 (two) times daily. One po bid x 7 days 06/24/14   Purvis Sheffield, MD  ibuprofen (ADVIL) 600 MG tablet Take 1 tablet (600 mg total) by mouth every 6 (six) hours as needed. 04/28/20   Bethel Born, PA-C  ondansetron (ZOFRAN ODT) 4 MG disintegrating tablet 4mg  ODT q4 hours prn nausea/vomit 06/24/14   Purvis Sheffield, MD                                                                                                                                    Past  Surgical History History reviewed. No pertinent surgical history. Family History Family History  Problem Relation Age of Onset   Cancer Father     Social History Social History   Tobacco Use   Smoking status: Never   Smokeless tobacco: Never  Substance Use Topics   Alcohol use: Yes    Comment: occ   Drug use: No   Allergies Patient has no known allergies.  Review of Systems Review of Systems  Constitutional:  Negative for chills and fever.  Respiratory:  Negative for chest tightness and shortness of breath.   Cardiovascular:  Positive for palpitations. Negative for chest pain and leg swelling.  Gastrointestinal:  Negative for abdominal pain, nausea and vomiting.  Genitourinary:  Negative for dysuria and urgency.  Musculoskeletal:  Negative for neck pain.  Skin:  Negative for color change.  Neurological:  Positive for dizziness.  All other systems reviewed and are negative.  Physical Exam Vital Signs  I have reviewed the triage vital signs BP (!) 156/80   Pulse 79   Temp 97.7 F (36.5 C) (Oral)   Resp 14   Ht 5\' 3"  (1.6 m)   Wt 77 kg   SpO2 95%   BMI 30.07 kg/m  Physical Exam Vitals and nursing note reviewed.  Constitutional:      General: She is not in acute distress.    Appearance: Normal appearance. She is not ill-appearing.  HENT:     Head: Normocephalic and atraumatic.     Right Ear: External ear normal.     Left Ear: External ear normal.     Nose: Nose normal.     Mouth/Throat:     Mouth: Mucous membranes are moist.  Eyes:     General: No scleral icterus.       Right eye: No discharge.        Left eye: No discharge.  Cardiovascular:     Rate and Rhythm: Normal rate and regular rhythm.     Pulses: Normal pulses.     Heart sounds: Normal heart sounds.     No S3 or S4 sounds.     Comments: Occ pvc's noted  Pulmonary:     Effort: Pulmonary effort is normal. No respiratory distress.     Breath sounds: Normal breath sounds. No stridor.   Abdominal:     General: Abdomen is flat. There is no distension.     Palpations: Abdomen is soft.     Tenderness: There is no abdominal tenderness.  Musculoskeletal:     Cervical back: No rigidity.     Right lower leg: No edema.     Left lower leg: No edema.  Skin:    General: Skin is warm and dry.     Capillary Refill: Capillary refill takes less than 2 seconds.  Neurological:     Mental Status: She is alert.  Psychiatric:        Mood and Affect: Mood normal.        Behavior: Behavior normal. Behavior is cooperative.     ED Results and Treatments Labs (all labs ordered are listed, but only abnormal results are displayed) Labs Reviewed  CBC WITH DIFFERENTIAL/PLATELET - Abnormal; Notable for the following components:      Result Value   WBC 10.7 (*)    Platelets 516 (*)    All other components within normal limits  COMPREHENSIVE METABOLIC PANEL - Abnormal; Notable for the following components:   Glucose, Bld 101 (*)    BUN 5 (*)    All other components within normal limits  TSH  T4, FREE  TROPONIN I (HIGH SENSITIVITY)  TROPONIN I (HIGH SENSITIVITY)                                                                                                                          Radiology DG Chest Port 1 View  Result Date: 07/30/2023 CLINICAL DATA:  62 year old female with palpitations, dizziness. EXAM: PORTABLE CHEST 1 VIEW COMPARISON:  Chest radiographs 06/24/2011. FINDINGS: Portable AP upright view at 0 minutes 53 hours. Larger heart size but still within normal limits. Other mediastinal contours are within normal limits. Visualized tracheal air column is within normal limits. Lung volumes remain within normal limits. Allowing for portable technique the lungs are clear. No pneumothorax or pleural effusion. Negative visible bowel gas. No acute osseous abnormality identified. IMPRESSION: No acute cardiopulmonary abnormality. Electronically Signed   By: Odessa Fleming M.D.   On: 07/30/2023  09:59    Pertinent labs & imaging results that were available during my care of the patient were reviewed by me and considered in my medical decision making (see MDM for details).  Medications Ordered in ED Medications - No data to display                                                                                                                                   Procedures Procedures  (including critical care time)  Medical Decision Making / ED Course    Medical Decision Making:    ROSLYNN BECKS is a 62 y.o. female with past medical history as below, significant for IDA who presents to the ED with complaint of dizziness, palpitations. The complaint involves an extensive differential diagnosis and also carries with it a high risk of complications and morbidity.  Serious etiology was considered. Ddx includes but is not limited to: dysrhythmia, atrial fibrillation, metabolic derangement, dehydration, orthostatic, disequilibrium, etc.  Complete initial physical exam performed, notably the patient  was nad, asymptomatic, HDS.    Reviewed and confirmed nursing documentation for past medical history, family history, social history.  Vital signs reviewed.    Clinical Course as of 07/30/23 1723  Tue Jul 30, 2023  1429 Delay in care secondary to technical difficulties with lab, delay in cmp [SG]  1447 Labs resulted, these are stable.  Patient is feeling better, no recurrence of symptoms while in the ER.  She was ambulatory to the restroom without recurrence of symptoms and without abnormality.  Cardiac workup is stable.  ACS unlikely.  Recommend outpatient follow-up with cardiology and evaluation of long-term monitoring/Zio patch.  [SG]  1449 [place] syncope rule LOW risk [SG]    Clinical Course User Index [SG] Tanda Rockers A, DO     Patient with transient palpitations, lightheadedness dizziness.  Symptoms have resolved.  Will collect screening labs, maintain telemetry  monitoring.   Symptoms have resolved, back to baseline.  Recommend o/p cardiology f/u   The patient improved significantly and was discharged in stable condition. Detailed discussions were had with the patient regarding current findings, and need for close f/u with PCP or on call doctor. The patient has been instructed to return immediately if the symptoms worsen in any way for re-evaluation. Patient verbalized understanding and is in agreement with current care plan. All questions answered prior  to discharge.               Additional history obtained: -Additional history obtained from na -External records from outside source obtained and reviewed including: Chart review including previous notes, labs, imaging, consultation notes including  Prior ED visits, home medications   Lab Tests: -I ordered, reviewed, and interpreted labs.   The pertinent results include:   Labs Reviewed  CBC WITH DIFFERENTIAL/PLATELET - Abnormal; Notable for the following components:      Result Value   WBC 10.7 (*)    Platelets 516 (*)    All other components within normal limits  COMPREHENSIVE METABOLIC PANEL - Abnormal; Notable for the following components:   Glucose, Bld 101 (*)    BUN 5 (*)    All other components within normal limits  TSH  T4, FREE  TROPONIN I (HIGH SENSITIVITY)  TROPONIN I (HIGH SENSITIVITY)    Notable for trop neg x2  EKG   EKG Interpretation Date/Time:  Tuesday July 30 2023 09:45:32 EDT Ventricular Rate:  77 PR Interval:  136 QRS Duration:  93 QT Interval:  409 QTC Calculation: 463 R Axis:   43  Text Interpretation: Sinus rhythm Confirmed by Tanda Rockers ([id number]) on 07/30/2023 9:47:19 AM         Imaging Studies ordered: I ordered imaging studies including CXR I independently visualized the following imaging with scope of interpretation limited to determining acute life threatening conditions related to emergency care; findings noted above I  independently visualized and interpreted imaging. I agree with the radiologist interpretation   Medicines ordered and prescription drug management: No orders of the defined types were placed in this encounter.   -I have reviewed the patients home medicines and have made adjustments as needed   Consultations Obtained: na   Cardiac Monitoring: The patient was maintained on a cardiac monitor.  I personally viewed and interpreted the cardiac monitored which showed an underlying rhythm of: NSR, occ pvc's Continuous pulse oximetry interpreted by myself, 99% on RA.  EMS rhythm strip shows occasional PVCs  Social Determinants of Health:  Diagnosis or treatment significantly limited by social determinants of health: no pcp   Reevaluation: After the interventions noted above, I reevaluated the patient and found that they have resolved  Co morbidities that complicate the patient evaluation History reviewed. No pertinent past medical history.    Dispostion: Disposition decision including need for hospitalization was considered, and patient discharged from emergency department.    Final Clinical Impression(s) / ED Diagnoses Final diagnoses:  Palpitations  Near syncope  PVC's (premature ventricular contractions)        Sloan Leiter, DO 07/30/23 1723

## 2023-07-30 NOTE — ED Triage Notes (Signed)
Pt present to ED from place of employment with c/o dizziness, palpitations. States onset of symptoms occurred at work. Pt denies chest pain, shortness of breath at this time. Pt A&Ox3 at this time.   EMS VS: 172/94 85 18 109 cbg

## 2023-07-30 NOTE — ED Notes (Signed)
Pt placed necklaces in personal purse prior to chest x-ray

## 2024-06-26 ENCOUNTER — Other Ambulatory Visit: Payer: Self-pay

## 2024-06-26 ENCOUNTER — Ambulatory Visit
Admission: RE | Admit: 2024-06-26 | Discharge: 2024-06-26 | Disposition: A | Discharge status: Home / Self Care | Source: Ambulatory Visit

## 2024-06-26 VITALS — BP 138/81 | HR 92 | Temp 98.0°F | Resp 18

## 2024-06-26 DIAGNOSIS — S43402A Unspecified sprain of left shoulder joint, initial encounter: Secondary | ICD-10-CM

## 2024-06-26 MED ORDER — DICLOFENAC SODIUM 50 MG PO TBEC: 50.0000 mg | DELAYED_RELEASE_TABLET | Freq: Two times a day (BID) | ORAL | 1 refills | Status: DC

## 2024-06-26 MED ORDER — BACLOFEN 10 MG PO TABS: 10.0000 mg | ORAL_TABLET | Freq: Three times a day (TID) | ORAL | 0 refills | Status: DC

## 2024-06-26 NOTE — ED Triage Notes (Signed)
 Let arm going numb it comes and goes - Entered by patient   Pt reports left arm has intermittently going numb for the last 3 days. Denies falls/injury. Feels like pins and needles. Episodes only last a few seconds. Denies pain

## 2024-06-26 NOTE — ED Provider Notes (Signed)
 [facility]  Note:  This document was prepared using Conservation officer, historic buildings and may include unintentional dictation errors.  MRN: [id number] DOB: 07-19-1961  Subjective:   Courtney Howe is a 63 y.o. female presenting for left arm numbness and tingling over the last 3 days.  Patient denies any known injury or trauma.  Patient states it feels like pins-and-needles.  She states the episodes only last for a few seconds and then go away and denies any pain.  Patient has not tried any over-the-counter treatment at home.  No current facility-administered medications for this encounter.  Current Outpatient Medications:    albuterol (VENTOLIN HFA) 108 (90 Base) MCG/ACT inhaler, Inhale 2 puffs into the lungs., Disp: , Rfl:    baclofen  (LIORESAL ) 10 MG tablet, Take 1 tablet (10 mg total) by mouth 3 (three) times daily., Disp: 30 each, Rfl: 0   diclofenac  (VOLTAREN ) 50 MG EC tablet, Take 1 tablet (50 mg total) by mouth 2 (two) times daily., Disp: 30 tablet, Rfl: 1   ciprofloxacin  (CIPRO ) 500 MG tablet, Take 1 tablet (500 mg total) by mouth 2 (two) times daily. One po bid x 7 days (Patient not taking: Reported on 06/26/2024), Disp: 14 tablet, Rfl: 0   ibuprofen  (ADVIL ) 600 MG tablet, Take 1 tablet (600 mg total) by mouth every 6 (six) hours as needed. (Patient not taking: Reported on 06/26/2024), Disp: 30 tablet, Rfl: 0   ondansetron  (ZOFRAN  ODT) 4 MG disintegrating tablet, 4mg  ODT q4 hours prn nausea/vomit (Patient not taking: Reported on 06/26/2024), Disp: 20 tablet, Rfl: 0   No Known Allergies  History reviewed. No pertinent past medical history.   History reviewed. No pertinent surgical history.  Family History  Problem Relation Age of Onset   Cancer Father     Social History   Tobacco Use   Smoking status: Never   Smokeless tobacco: Never  Vaping Use   Vaping status: Never Used  Substance Use Topics   Alcohol use: Yes    Comment: 1-2 beers daily   Drug use: No     ROS Refer to HPI for ROS details.  Objective:   Vitals: BP 138/81   Pulse 92   Temp 98 F (36.7 C) (Oral)   Resp 18   SpO2 96%   Physical Exam Vitals and nursing note reviewed.  Constitutional:      General: She is not in acute distress.    Appearance: Normal appearance. She is well-developed. She is not ill-appearing or toxic-appearing.  HENT:     Head: Normocephalic and atraumatic.  Cardiovascular:     Rate and Rhythm: Normal rate.  Pulmonary:     Effort: Pulmonary effort is normal. No respiratory distress.  Musculoskeletal:        General: Normal range of motion.     Left shoulder: No swelling, tenderness or bony tenderness. Normal range of motion. Normal strength. Normal pulse.     Left upper arm: No swelling, tenderness or bony tenderness.  Skin:    General: Skin is warm and dry.  Neurological:     General: No focal deficit present.     Mental Status: She is alert and oriented to person, place, and time.  Psychiatric:        Mood and Affect: Mood normal.        Behavior: Behavior normal.     Procedures  No results found for this or any previous visit (from the past 24 hours).  No results found.  Assessment and Plan :     Discharge Instructions       1. Sprain of left shoulder, unspecified shoulder sprain type, initial encounter (Primary) - Apply Sling & Swathe to left arm for protection and immobilization until injury improves. - diclofenac  (VOLTAREN ) 50 MG EC tablet; Take 1 tablet (50 mg total) by mouth 2 (two) times daily.  Dispense: 30 tablet; Refill: 1 - baclofen  (LIORESAL ) 10 MG tablet; Take 1 tablet (10 mg total) by mouth 3 (three) times daily.  Dispense: 30 each; Refill: 0 - AMB referral to orthopedics for follow-up evaluation and management of left shoulder numbness and tingling.       Lorianna Spadaccini B Kendall Justo   Yuri Fana, [place], [place] 06/26/24 1534

## 2024-06-26 NOTE — Discharge Instructions (Signed)
  1. Sprain of left shoulder, unspecified shoulder sprain type, initial encounter (Primary) - Apply Sling & Swathe to left arm for protection and immobilization until injury improves. - diclofenac  (VOLTAREN ) 50 MG EC tablet; Take 1 tablet (50 mg total) by mouth 2 (two) times daily.  Dispense: 30 tablet; Refill: 1 - baclofen  (LIORESAL ) 10 MG tablet; Take 1 tablet (10 mg total) by mouth 3 (three) times daily.  Dispense: 30 each; Refill: 0 - AMB referral to orthopedics for follow-up evaluation and management of left shoulder numbness and tingling.

## 2024-06-30 ENCOUNTER — Ambulatory Visit (HOSPITAL_BASED_OUTPATIENT_CLINIC_OR_DEPARTMENT_OTHER)
Admission: RE | Admit: 2024-06-30 | Discharge: 2024-06-30 | Disposition: A | Discharge status: Home / Self Care | Source: Ambulatory Visit | Attending: Family Medicine | Admitting: Family Medicine

## 2024-06-30 ENCOUNTER — Ambulatory Visit

## 2024-06-30 ENCOUNTER — Encounter: Payer: Self-pay | Admitting: Family Medicine

## 2024-06-30 ENCOUNTER — Ambulatory Visit: Admitting: Family Medicine

## 2024-06-30 VITALS — BP 136/82 | Ht 63.0 in | Wt 170.0 lb

## 2024-06-30 DIAGNOSIS — M5412 Radiculopathy, cervical region: Secondary | ICD-10-CM | POA: Insufficient documentation

## 2024-06-30 MED ORDER — PREDNISONE 10 MG PO TABS: ORAL_TABLET | ORAL | 0 refills | Status: DC

## 2024-06-30 NOTE — Progress Notes (Addendum)
 PCP: Pcp, No  Subjective:   HPI: Patient is a 63 y.o. female here for new onset left arm numbness and tingling for the last 1-2 weeks.  Denies any inciting events.  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  Numbness and tingling will come on sporadically without any known cause or activity.  Numbness/tingling present from shoulder all the way down to the tips of all of her fingers.  Usually lasts a couple seconds to 1 minute at a time before going away.  Last Friday it lasted for the majority of the day which made her seek evaluation at urgent care.  Urgent care diagnosed her with shoulder sprain and gave her baclofen  and Diclofenac  pills to take.  Has noticed minimal to no improvement since prescribed medication.  Patient denies any associated weakness with numbness tingling.  Denies any chest pain.  Symptoms are not brought on by exercise or increased exertion.  No other neurodeficits associated with symptom onset.  Denies any recent fevers or illnesses.  History reviewed. No pertinent past medical history.  Current Outpatient Medications on File Prior to Visit  Medication Sig Dispense Refill   albuterol (VENTOLIN HFA) 108 (90 Base) MCG/ACT inhaler Inhale 2 puffs into the lungs.     baclofen  (LIORESAL ) 10 MG tablet Take 1 tablet (10 mg total) by mouth 3 (three) times daily. 30 each 0   ciprofloxacin  (CIPRO ) 500 MG tablet Take 1 tablet (500 mg total) by mouth 2 (two) times daily. One po bid x 7 days (Patient not taking: Reported on 06/26/2024) 14 tablet 0   diclofenac  (VOLTAREN ) 50 MG EC tablet Take 1 tablet (50 mg total) by mouth 2 (two) times daily. 30 tablet 1   ibuprofen  (ADVIL ) 600 MG tablet Take 1 tablet (600 mg total) by mouth every 6 (six) hours as needed. (Patient not taking: Reported on 06/26/2024) 30 tablet 0   ondansetron  (ZOFRAN  ODT) 4 MG disintegrating tablet 4mg  ODT q4 hours prn nausea/vomit (Patient not taking: Reported on 06/26/2024) 20 tablet 0   No current  facility-administered medications on file prior to visit.    History reviewed. No pertinent surgical history.  No Known Allergies  BP 136/82   Ht 5' 3 (1.6 m)   Wt 170 lb (77.1 kg)   BMI 30.11 kg/m       No data to display              No data to display              Objective:  Physical Exam:  Gen: NAD, comfortable in exam room MSK: On examination no obvious erythema, ecchymoses, or edema surrounding left shoulder or proximally up into her neck.  No evidence of recent traumatic events.  Patient has reproduction of pain with palpation of her brachial plexus inferior to the coracoid process.  Tinel's sign positive at pectoralis minor insertion.  She does have mild reproduction of pain as well with Spurling's test.  She has maintained range of motion of both her neck and shoulder.  5 out of 5 strength in bilateral upper extremities.   Justina Horn test positive.   Neuro: Sensation intact to light touch upper extremity bilaterally, reflexes 2+ bicep, tricep, brachial radialis bilaterally Vascular: Pulses 2+ and symmetric radial artery bilaterally   Assessment & Plan:   #Left upper extremity radicular/neuropathic symptoms -Differential includes cervical radiculopathy vs neurogenic thoracic outlet syndrome vs other  PLAN: - Will obtain cervical x-ray series to assess for canal stenosis  or DDD.  Will go get these x-rays completed at [facility] imaging later today -Will give patient 6-day prednisone taper to decrease inflammation and help reduce neuropathic pain -Instructed to stop taking medication prescribed by urgent care at this time -Will send referral to physical therapy to work on thoracic outlet release, pectoralis minor release, as well as C-spine mobilizations to help prevent entrapment of brachial plexus causing neuropathic pain -Patient will return to our clinic in 6 weeks for reassessment.  Could consider MRI if not improving - Patient understands and agrees to  treatment plan.  No further questions or concerns at this time.

## 2024-07-06 ENCOUNTER — Ambulatory Visit: Payer: Self-pay | Admitting: Family Medicine

## 2024-07-06 NOTE — Progress Notes (Signed)
 Focal spine x-rays reviewed.  Does have degenerative changes which could be contributing to radicular symptoms.  MyChart message sent.  Should continue with plan as discussed at visit

## 2024-07-07 ENCOUNTER — Ambulatory Visit: Payer: Self-pay | Admitting: Family Medicine

## 2024-07-07 ENCOUNTER — Encounter: Payer: Self-pay | Admitting: Family Medicine

## 2024-07-07 VITALS — BP 130/80 | HR 81 | Ht 63.0 in | Wt 183.6 lb

## 2024-07-07 DIAGNOSIS — Z0000 Encounter for general adult medical examination without abnormal findings: Secondary | ICD-10-CM

## 2024-07-07 DIAGNOSIS — Z136 Encounter for screening for cardiovascular disorders: Secondary | ICD-10-CM | POA: Diagnosis not present

## 2024-07-07 DIAGNOSIS — M5412 Radiculopathy, cervical region: Secondary | ICD-10-CM | POA: Diagnosis not present

## 2024-07-07 DIAGNOSIS — R7989 Other specified abnormal findings of blood chemistry: Secondary | ICD-10-CM

## 2024-07-07 DIAGNOSIS — Z124 Encounter for screening for malignant neoplasm of cervix: Secondary | ICD-10-CM

## 2024-07-07 DIAGNOSIS — S43402A Unspecified sprain of left shoulder joint, initial encounter: Secondary | ICD-10-CM

## 2024-07-07 DIAGNOSIS — K5900 Constipation, unspecified: Secondary | ICD-10-CM

## 2024-07-07 DIAGNOSIS — Z1211 Encounter for screening for malignant neoplasm of colon: Secondary | ICD-10-CM | POA: Diagnosis not present

## 2024-07-07 DIAGNOSIS — E669 Obesity, unspecified: Secondary | ICD-10-CM | POA: Diagnosis not present

## 2024-07-07 LAB — LIPID PANEL
Chol/HDL Ratio: 4 ratio (ref 0.0–4.4)
Cholesterol, Total: 246 mg/dL — ABNORMAL HIGH (ref 100–199)
HDL: 62 mg/dL (ref >39)
LDL Chol Calc (NIH): 154 mg/dL — ABNORMAL HIGH (ref 0–99)
Triglycerides: 168 mg/dL — ABNORMAL HIGH (ref 0–149)
VLDL Cholesterol Cal: 30 mg/dL (ref 5–40)

## 2024-07-07 LAB — BASIC METABOLIC PANEL WITH GFR
BUN/Creatinine Ratio: 18 (ref 12–28)
BUN: 14 mg/dL (ref 8–27)
CO2: 23 mmol/L (ref 20–29)
Calcium: 9.4 mg/dL (ref 8.7–10.3)
Chloride: 100 mmol/L (ref 96–106)
Creatinine, Ser: 0.79 mg/dL (ref 0.57–1.00)
Glucose: 77 mg/dL (ref 70–99)
Potassium: 4.7 mmol/L (ref 3.5–5.2)
Sodium: 136 mmol/L (ref 134–144)
eGFR: 84 mL/min/1.73 (ref >59)

## 2024-07-07 LAB — CBC WITH DIFFERENTIAL/PLATELET
Basophils Absolute: 0.1 x10E3/uL (ref 0.0–0.2)
Basos: 1 %
EOS (ABSOLUTE): 0.3 x10E3/uL (ref 0.0–0.4)
Eos: 3 %
Hematocrit: 44.4 % (ref 34.0–46.6)
Hemoglobin: 14.5 g/dL (ref 11.1–15.9)
Immature Grans (Abs): 0 x10E3/uL (ref 0.0–0.1)
Immature Granulocytes: 0 %
Lymphocytes Absolute: 3.1 x10E3/uL (ref 0.7–3.1)
Lymphs: 31 %
MCH: 30.3 pg (ref 26.6–33.0)
MCHC: 32.7 g/dL (ref 31.5–35.7)
MCV: 93 fL (ref 79–97)
Monocytes Absolute: 0.9 x10E3/uL (ref 0.1–0.9)
Monocytes: 8 %
Neutrophils Absolute: 5.7 x10E3/uL (ref 1.4–7.0)
Neutrophils: 57 %
Platelets: 404 x10E3/uL (ref 150–450)
RBC: 4.78 x10E6/uL (ref 3.77–5.28)
RDW: 12.6 % (ref 11.7–15.4)
WBC: 10.1 x10E3/uL (ref 3.4–10.8)

## 2024-07-07 LAB — HEPATITIS C ANTIBODY: Hep C Virus Ab: NONREACTIVE

## 2024-07-07 LAB — POCT GLYCOSYLATED HEMOGLOBIN (HGB A1C): Hemoglobin A1C: 5.5 % (ref 4.0–5.6)

## 2024-07-07 MED ORDER — POLYETHYLENE GLYCOL 3350 17 GM/SCOOP PO POWD: 17.0000 g | Freq: Every day | ORAL | 1 refills | Status: AC

## 2024-07-07 MED ORDER — GABAPENTIN 300 MG PO CAPS: 300.0000 mg | ORAL_CAPSULE | Freq: Three times a day (TID) | ORAL | 3 refills | Status: AC

## 2024-07-07 MED ORDER — DICLOFENAC SODIUM 50 MG PO TBEC: 50.0000 mg | DELAYED_RELEASE_TABLET | Freq: Two times a day (BID) | ORAL | 1 refills | Status: AC

## 2024-07-07 NOTE — Progress Notes (Signed)
 Name: Courtney Howe   Date of Visit: 07/07/24   Date of last visit with me: Visit date not found   CHIEF COMPLAINT:  Chief Complaint  Patient presents with   Establish Care    New patient.        HPI:  Discussed the use of AI scribe software for clinical note transcription with the patient, who gave verbal consent to proceed.  History of Present Illness   Courtney Howe is a 63 year old female who presents for a primary care visit and annual physical exam.  She experiences ongoing neck pain with associated numbness and tingling radiating down her arm. The sensation is described as 'pins and needles,' starting in the neck and traveling down the arm. 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  She experiences bloating and occasional constipation, despite a high water intake of approximately 60 ounces daily. She reports difficulty eating due to swelling.  She does not smoke and drinks alcohol occasionally, about one beer a day. She is mindful of her weight and blood pressure, noting that she has been watching her diet.         OBJECTIVE:       07/07/2024    8:34 AM  Depression screen PHQ 2/9  Decreased Interest 0  Down, Depressed, Hopeless 0  PHQ - 2 Score 0     BP Readings from Last 3 Encounters:  07/07/24 130/80  06/30/24 136/82  06/26/24 138/81    BP 130/80   Pulse 81   Ht 5' 3 (1.6 m)   Wt 183 lb 9.6 oz (83.3 kg)   SpO2 96%   BMI 32.52 kg/m    Physical Exam          Physical Exam Constitutional:      Appearance: Normal appearance.  Neurological:     General: No focal deficit present.     Mental Status: She is alert and oriented to person, place, and time. Mental status is at baseline.     Sensory: Sensory deficit (left arm likely 2/2 to radiculitis) present.     ASSESSMENT/PLAN:   Assessment & Plan Annual physical exam  Encounter for screening  for cardiovascular disorders  Encounter for screening colonoscopy  High platelet count  Papanicolaou smear for cervical cancer screening  Obesity (BMI 30-39.9)  Constipation, unspecified constipation type  Sprain of left shoulder, unspecified shoulder sprain type, initial encounter  Cervical radiculitis    Assessment and Plan    Annual Physical -- Full history and exam completed today in addition to [facility] Wellness Visit. Reviewed interval concerns, chronic conditions, and preventive care needs. Physical exam performed. Counseling provided on lifestyle, screenings, vaccines, and routine health maintenance. - Colonoscopy screening  - Pap smear referral - Mammogram ordered - Hep C lab.  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 - Provide neck exercises to strengthen muscles and reduce pain. - Prescribe gabapentin 300mg  three times daily for nerve pain. - Refill diclofenac  for pain management. - Follow up in six weeks to assess improvement. - Consider MRI if no improvement after six weeks. - Discuss potential for nerve injections if MRI indicates necessity.  Constipation Intermittent constipation likely due to low fiber intake despite adequate hydration. - Prescribe Miralax once daily for two weeks. - Recommend Metamucil daily after Miralax course. -  Increase dietary fiber intake.  Elevated blood pressure without diagnosis of hypertension Borderline elevated blood pressure requiring lifestyle modifications to prevent hypertension. - Recommend weight loss of 10 pounds. - Encourage increased physical activity and walking.  - Bmp  Hx of elevate platelets - Will repeat CBC to evaluate        Jasime Westergren A. [place] MD [facility]

## 2024-07-07 NOTE — Addendum Note (Signed)
 Addended by: Sharelle Burditt on: 07/07/2024 02:31 PM   Modules accepted: Level of Service

## 2024-07-08 ENCOUNTER — Ambulatory Visit: Payer: Self-pay | Admitting: Family Medicine

## 2024-07-09 ENCOUNTER — Ambulatory Visit: Attending: Family Medicine

## 2024-07-09 ENCOUNTER — Other Ambulatory Visit: Payer: Self-pay

## 2024-07-09 DIAGNOSIS — M542 Cervicalgia: Secondary | ICD-10-CM | POA: Diagnosis present

## 2024-07-09 DIAGNOSIS — M5412 Radiculopathy, cervical region: Secondary | ICD-10-CM | POA: Insufficient documentation

## 2024-07-09 NOTE — Therapy (Signed)
 OUTPATIENT PHYSICAL THERAPY CERVICAL EVALUATION   Patient Name: Courtney Howe MRN: [id number] DOB:Aug 03, 1961, 63 y.o., female Today's Date: 07/09/2024  END OF SESSION:  PT End of Session - 07/09/24 1627     Visit Number 1    Number of Visits 16    Authorization Type [employer]    PT Start Time 0330    PT Stop Time 0415    PT Time Calculation (min) 45 min    Activity Tolerance Patient tolerated treatment well          History reviewed. No pertinent past medical history. History reviewed. No pertinent surgical history. Patient Active Problem List   Diagnosis Date Noted   ANEMIA-IRON DEFICIENCY 07/25/2009   COUGH 07/25/2009   CHEST PAIN 07/25/2009    PCP: Vita Morrow, MD PCP - General   REFERRING PROVIDER: Teressa Rainell BROCKS, DO Ref Provider   REFERRING DIAG: [id number] (ICD-10-CM) - Left cervical radiculopathy  THERAPY DIAG:  Radiculopathy, cervical region  Neck pain  Painful cervical ROM  Rationale for Evaluation and Treatment: Rehabilitation  ONSET DATE: 06/30/24  SUBJECTIVE:                                                                                                                                                                                                         SUBJECTIVE STATEMENT: Pain started 3 weeks ago with no rhyme or reason. I can't cook to my full potential because of the pain. I also noticed my left arm feels more fatigued and weaker than usual (dominant)     PAIN:  3/10 C, 7/10 W Are you having pain? Yes: NPRS scale: 10 Pain location: L UT, middle neck, left arm into top of hand  Pain description: sharp, pins and needles, nx Aggravating factors: cooking, using arms too much, neck movement Relieving factors: medication  PRECAUTIONS: None  RED FLAGS: None     WEIGHT BEARING RESTRICTIONS: No  FALLS:  Has patient fallen in last 6 months? Yes. Number of falls once   PLOF: Independent  PATIENT GOALS: going back to cooking,  decreasing pain.    OBJECTIVE:  Note: Objective measures were completed at Evaluation unless otherwise noted.    PATIENT SURVEYS:  NDI:  NECK DISABILITY INDEX  Date: 07/09/24 Score  Pain intensity   2. Personal care (washing, dressing, etc.)   3. Lifting   4. Reading   5. Headaches   6. Concentration   7. Work   8. Driving   9. Sleeping   10. Recreation   Total 6/50   Minimum Detectable Change (  90% confidence): 5 points or 10% points  SENSATION: L arm goes numb when doing shrugging movements and specific cervical movements  POSTURE: rounded shoulders and forward head  PALPATION: TTP right trap, midline cervical PS, LUT   CERVICAL ROM:   Active ROM A/PROM (deg) eval  Flexion 38 p! (N/t and pain down arm)  Extension 20  Right lateral flexion 20  Left lateral flexion 3 p! (N/t and pain down arm)  Right rotation 22 p! (N/t and pain down arm)  Left rotation 47   (Blank rows = not tested)  UPPER EXTREMITY ROM:  Active ROM Right eval Left eval  Shoulder flexion    Shoulder extension    Shoulder abduction    Shoulder adduction    Shoulder extension    Shoulder internal rotation    Shoulder external rotation    Elbow flexion    Elbow extension    Wrist flexion    Wrist extension    Wrist ulnar deviation    Wrist radial deviation    Wrist pronation    Wrist supination     (Blank rows = not tested)  UPPER EXTREMITY MMT:  MMT Right eval Left eval  Shoulder flexion    Shoulder extension    Shoulder abduction    Shoulder adduction    Shoulder extension    Shoulder internal rotation    Shoulder external rotation    Middle trapezius    Lower trapezius    Elbow flexion    Elbow extension    Wrist flexion    Wrist extension    Wrist ulnar deviation    Wrist radial deviation    Wrist pronation    Wrist supination    Grip strength     (Blank rows = not tested)  CERVICAL SPECIAL TESTS:  +spurling (L side)   TREATMENT DATE:  TREATMENT  07/09/2024:   Neuromuscular re-ed: (Sx modulation) - supine chin tuck x8x3s -supine cervical rotation x8 (p!), x6 -supine alternating arms x4 (DC,p! and ROM not enough) -supine shrug x3x3s (DC, p!) -seated shrug x6x3s -seated row x2x3s (DC, p!)   Therapeutic Activity: - educated PT on diagnosis, prognosis, POC, HEP, and relevant tissues and anatomy.                                                                                                                                     PATIENT EDUCATION:  Education details: HEP, prognosis, relevant tissues and anatomy Person educated: Patient Education method: Explanation, Demonstration, Tactile cues, Verbal cues, and Handouts Education comprehension: verbalized understanding, returned demonstration, verbal cues required, tactile cues required, and needs further education  HOME EXERCISE PROGRAM: Access Code: [email or web address] URL: [email or web address] Date: 07/09/2024 Prepared by: Washington Scot  Exercises - Supine Chin Tuck  - 2 x daily - 5 x weekly - 2-3 sets - 6 reps - 3 hold - Supine Cervical Rotation AROM on Pillow  - 2 x daily -  5 x weekly - 2-3 sets - 6 reps - 3 hold - Seated Shoulder Shrug  - 2 x daily - 5 x weekly - 2-3 sets - 6 reps - 3 hold  ASSESSMENT:  CLINICAL IMPRESSION: Patient is a 63 y.o. female who was seen today for physical therapy evaluation and treatment for left cervical radiculopathy. Current deficits are debilitating pain triggered with cervical AROM, left UE ROM, and shoulder shrugging. Cervical ROM noted to be extremely limited with highly irritable pain/nx and tingling spreading down into L arm. Pt would benefit from skilled PT to address said impairments via plan down below.   OBJECTIVE IMPAIRMENTS: decreased ROM, impaired sensation, impaired UE functional use, postural dysfunction, and pain.    REHAB POTENTIAL: Fair    CLINICAL DECISION MAKING: Evolving/moderate complexity  EVALUATION  COMPLEXITY: Moderate   GOALS: Goals reviewed with patient? No  SHORT TERM GOALS: Target date: 07/30/2024    Patient will decrease worse pain to 5/10 at most to improve ADL completion and overall QOL  Baseline: 7/10 Goal status: INITIAL  2.  Patient will be able to cook at least 60% capacity to demonstrate improvements in functional UE use ADL's. Baseline: <50% Goal status: INITIAL   LONG TERM GOALS: Target date: 09/08/2024  Patient will demonstrate a 2 point improvement in NDI to show improvements in ADL completion and overall QOL   Baseline: 6 Goal status: INITIAL  2.  Patient will demonstrate ability to lift 10# overhead to demonstrate functional strength needed for cooking/ADLs  Baseline: 0% Goal status: INITIAL  3. Patient will decrease worst pain to 4/10 at most to improve ADL completion and overall QOL  Baseline: 7/10 Goal status: INITIAL  4. Patient will have at least [facility] ROM of Cervical spine to demonstrate improved joint mobility needed for ADL completion  Baseline: 0%  Goal status: initial       PLAN:    PT FREQUENCY: 1-2x/week  PT DURATION: 8 weeks  PLANNED INTERVENTIONS: 97110-Therapeutic exercises, 97530- Therapeutic activity, 97112- Neuromuscular re-education, 97535- Self Care, [id number]- Manual therapy, and Patient/Family education  PLAN FOR NEXT SESSION: HEP assessment and progression, symptom modulation, and loading (isolated/functional). Manual therapy and NME training as needed.     Washington Greener Cassandra Mcmanaman  PT, DTP  For all possible CPT codes, reference the Planned Interventions line above.     Check all conditions that are expected to impact treatment: {Conditions expected to impact treatment:Musculoskeletal disorders   If treatment provided at initial evaluation, no treatment charged due to lack of authorization.

## 2024-07-14 ENCOUNTER — Encounter

## 2024-07-14 NOTE — Therapy (Incomplete)
 OUTPATIENT PHYSICAL THERAPY TREATMENT   Patient Name: Courtney Howe MRN: [id number] DOB:Mar 30, 1961, 63 y.o., female Today's Date: 07/14/2024  END OF SESSION:    No past medical history on file. No past surgical history on file. Patient Active Problem List   Diagnosis Date Noted   ANEMIA-IRON DEFICIENCY 07/25/2009   COUGH 07/25/2009   CHEST PAIN 07/25/2009    PCP: Vita Morrow, MD PCP - General   REFERRING PROVIDER: Teressa Rainell BROCKS, DO Ref Provider   REFERRING DIAG: M54.12 (ICD-10-CM) - Left cervical radiculopathy  THERAPY DIAG:  No diagnosis found.  Rationale for Evaluation and Treatment: Rehabilitation  ONSET DATE: 06/30/24  SUBJECTIVE:                                                                                                                                                                                                         SUBJECTIVE STATEMENT: ***  PAIN:  3/10 C, 7/10 W Are you having pain?  Yes: NPRS scale: 10 Pain location: L UT, middle neck, left arm into top of hand  Pain description: sharp, pins and needles, nx Aggravating factors: cooking, using arms too much, neck movement Relieving factors: medication  PRECAUTIONS: None  RED FLAGS: None     WEIGHT BEARING RESTRICTIONS: No  FALLS:  Has patient fallen in last 6 months? Yes. Number of falls once   PLOF: Independent  PATIENT GOALS: going back to cooking, decreasing pain.    OBJECTIVE:  Note: Objective measures were completed at Evaluation unless otherwise noted.    PATIENT SURVEYS:  NDI:  NECK DISABILITY INDEX  Date: 07/09/24 Score  Pain intensity   2. Personal care (washing, dressing, etc.)   3. Lifting   4. Reading   5. Headaches   6. Concentration   7. Work   8. Driving   9. Sleeping   10. Recreation   Total 6/50   Minimum Detectable Change (90% confidence): 5 points or 10% points  SENSATION: L arm goes numb when doing shrugging movements and specific cervical  movements  POSTURE: rounded shoulders and forward head  PALPATION: TTP right trap, midline cervical PS, LUT   CERVICAL ROM:   Active ROM A/PROM (deg) eval  Flexion 38 p! (N/t and pain down arm)  Extension 20  Right lateral flexion 20  Left lateral flexion 3 p! (N/t and pain down arm)  Right rotation 22 p! (N/t and pain down arm)  Left rotation 47   (Blank rows = not tested)  UPPER EXTREMITY ROM:  Active ROM Right eval Left eval  Shoulder flexion  Shoulder extension    Shoulder abduction    Shoulder adduction    Shoulder extension    Shoulder internal rotation    Shoulder external rotation    Elbow flexion    Elbow extension    Wrist flexion    Wrist extension    Wrist ulnar deviation    Wrist radial deviation    Wrist pronation    Wrist supination     (Blank rows = not tested)  UPPER EXTREMITY MMT:  MMT Right eval Left eval  Shoulder flexion    Shoulder extension    Shoulder abduction    Shoulder adduction    Shoulder extension    Shoulder internal rotation    Shoulder external rotation    Middle trapezius    Lower trapezius    Elbow flexion    Elbow extension    Wrist flexion    Wrist extension    Wrist ulnar deviation    Wrist radial deviation    Wrist pronation    Wrist supination    Grip strength     (Blank rows = not tested)  CERVICAL SPECIAL TESTS:  +spurling (L side)   TREATMENT DATE:  TREATMENT 07/14/2024: Neuromuscular re-ed: (Sx modulation) - supine chin tuck x8x3s -supine cervical rotation x8 (p!), x6 -supine alternating arms x4 (DC,p! and ROM not enough) -supine shrug x3x3s (DC, p!) -seated shrug x6x3s -seated row x2x3s (DC, p!) Therapeutic Activity: - educated PT on diagnosis, prognosis, POC, HEP, and relevant tissues and anatomy.                                                                                                                            PATIENT EDUCATION:  Education details: HEP, prognosis, relevant  tissues and anatomy Person educated: Patient Education method: Explanation, Demonstration, Tactile cues, Verbal cues, and Handouts Education comprehension: verbalized understanding, returned demonstration, verbal cues required, tactile cues required, and needs further education  HOME EXERCISE PROGRAM: Access Code: [email or web address] URL: [email or web address] Date: 07/09/2024 Prepared by: Washington Scot  Exercises - Supine Chin Tuck  - 2 x daily - 5 x weekly - 2-3 sets - 6 reps - 3 hold - Supine Cervical Rotation AROM on Pillow  - 2 x daily - 5 x weekly - 2-3 sets - 6 reps - 3 hold - Seated Shoulder Shrug  - 2 x daily - 5 x weekly - 2-3 sets - 6 reps - 3 hold  ASSESSMENT:  CLINICAL IMPRESSION: ***  EVAL: Patient is a 63 y.o. female who was seen today for physical therapy evaluation and treatment for left cervical radiculopathy. Current deficits are debilitating pain triggered with cervical AROM, left UE ROM, and shoulder shrugging. Cervical ROM noted to be extremely limited with highly irritable pain/nx and tingling spreading down into L arm. Pt would benefit from skilled PT to address said impairments via plan down below.   OBJECTIVE IMPAIRMENTS: decreased ROM, impaired sensation, impaired UE functional use, postural dysfunction, and pain.  REHAB POTENTIAL: Fair    CLINICAL DECISION MAKING: Evolving/moderate complexity  EVALUATION COMPLEXITY: Moderate   GOALS: Goals reviewed with patient? No  SHORT TERM GOALS: Target date: 07/30/2024    Patient will decrease worse pain to 5/10 at most to improve ADL completion and overall QOL  Baseline: 7/10 Goal status: INITIAL  2.  Patient will be able to cook at least 60% capacity to demonstrate improvements in functional UE use ADL's. Baseline: <50% Goal status: INITIAL   LONG TERM GOALS: Target date: 09/08/2024  Patient will demonstrate a 2 point improvement in NDI to show improvements in ADL completion and overall QOL    Baseline: 6 Goal status: INITIAL  2.  Patient will demonstrate ability to lift 10# overhead to demonstrate functional strength needed for cooking/ADLs  Baseline: 0% Goal status: INITIAL  3. Patient will decrease worst pain to 4/10 at most to improve ADL completion and overall QOL  Baseline: 7/10 Goal status: INITIAL  4. Patient will have at least [facility] ROM of Cervical spine to demonstrate improved joint mobility needed for ADL completion  Baseline: 0%  Goal status: initial       PLAN:    PT FREQUENCY: 1-2x/week  PT DURATION: 8 weeks  PLANNED INTERVENTIONS: 97110-Therapeutic exercises, 97530- Therapeutic activity, 97112- Neuromuscular re-education, 97535- Self Care, 02859- Manual therapy, and Patient/Family education  PLAN FOR NEXT SESSION: HEP assessment and progression, symptom modulation, and loading (isolated/functional). Manual therapy and NME training as needed.     Alm JAYSON Kingdom PT  07/14/24 7:53 AM

## 2024-07-21 ENCOUNTER — Ambulatory Visit

## 2024-07-28 ENCOUNTER — Telehealth: Payer: Self-pay

## 2024-07-28 ENCOUNTER — Ambulatory Visit

## 2024-07-28 ENCOUNTER — Ambulatory Visit: Admitting: Family Medicine

## 2024-07-28 NOTE — Telephone Encounter (Signed)
 Courtney Howe  PT, DPT

## 2024-08-04 ENCOUNTER — Telehealth: Payer: Self-pay

## 2024-08-04 ENCOUNTER — Ambulatory Visit: Attending: Family Medicine

## 2024-08-04 NOTE — Telephone Encounter (Signed)
 Courtney Howe  PT, DPT

## 2024-08-04 NOTE — Therapy (Incomplete)
 OUTPATIENT PHYSICAL THERAPY CERVICAL EVALUATION   Patient Name: Courtney Howe MRN: [id number] DOB:Mar 07, 1961, 63 y.o., female Today's Date: 08/04/2024  END OF SESSION:    No past medical history on file. No past surgical history on file. Patient Active Problem List   Diagnosis Date Noted   ANEMIA-IRON DEFICIENCY 07/25/2009   COUGH 07/25/2009   CHEST PAIN 07/25/2009    PCP: Vita Morrow, MD PCP - General   REFERRING PROVIDER: Teressa Rainell BROCKS, DO Ref Provider   REFERRING DIAG: M54.12 (ICD-10-CM) - Left cervical radiculopathy  THERAPY DIAG:  No diagnosis found.  Rationale for Evaluation and Treatment: Rehabilitation  ONSET DATE: 06/30/24  SUBJECTIVE:                                                                                                                                                                                                         SUBJECTIVE STATEMENT: Pain started 3 weeks ago with no rhyme or reason. I can't cook to my full potential because of the pain. I also noticed my left arm feels more fatigued and weaker than usual (dominant)     PAIN:  3/10 C, 7/10 W Are you having pain? Yes: NPRS scale: 10 Pain location: L UT, middle neck, left arm into top of hand  Pain description: sharp, pins and needles, nx Aggravating factors: cooking, using arms too much, neck movement Relieving factors: medication  PRECAUTIONS: None  RED FLAGS: None     WEIGHT BEARING RESTRICTIONS: No  FALLS:  Has patient fallen in last 6 months? Yes. Number of falls once   PLOF: Independent  PATIENT GOALS: going back to cooking, decreasing pain.    OBJECTIVE:  Note: Objective measures were completed at Evaluation unless otherwise noted.    PATIENT SURVEYS:  NDI:  NECK DISABILITY INDEX  Date: 07/09/24 Score  Pain intensity   2. Personal care (washing, dressing, etc.)   3. Lifting   4. Reading   5. Headaches   6. Concentration   7. Work   8. Driving   9.  Sleeping   10. Recreation   Total 6/50   Minimum Detectable Change (90% confidence): 5 points or 10% points  SENSATION: L arm goes numb when doing shrugging movements and specific cervical movements  POSTURE: rounded shoulders and forward head  PALPATION: TTP right trap, midline cervical PS, LUT   CERVICAL ROM:   Active ROM A/PROM (deg) eval  Flexion 38 p! (N/t and pain down arm)  Extension 20  Right lateral flexion 20  Left lateral flexion 3 p! (N/t and pain  down arm)  Right rotation 22 p! (N/t and pain down arm)  Left rotation 47   (Blank rows = not tested)  UPPER EXTREMITY ROM:  Active ROM Right eval Left eval  Shoulder flexion    Shoulder extension    Shoulder abduction    Shoulder adduction    Shoulder extension    Shoulder internal rotation    Shoulder external rotation    Elbow flexion    Elbow extension    Wrist flexion    Wrist extension    Wrist ulnar deviation    Wrist radial deviation    Wrist pronation    Wrist supination     (Blank rows = not tested)  UPPER EXTREMITY MMT:  MMT Right eval Left eval  Shoulder flexion    Shoulder extension    Shoulder abduction    Shoulder adduction    Shoulder extension    Shoulder internal rotation    Shoulder external rotation    Middle trapezius    Lower trapezius    Elbow flexion    Elbow extension    Wrist flexion    Wrist extension    Wrist ulnar deviation    Wrist radial deviation    Wrist pronation    Wrist supination    Grip strength     (Blank rows = not tested)  CERVICAL SPECIAL TESTS:  +spurling (L side)   TREATMENT DATE:  TREATMENT 08/04/2024:   Neuromuscular re-ed: (Sx modulation) - supine chin tuck x8x3s -supine cervical rotation x8 (p!), x6 -supine alternating arms x4 (DC,p! and ROM not enough) -supine shrug x3x3s (DC, p!) -seated shrug x6x3s -seated row x2x3s (DC, p!)   Therapeutic Activity: - educated PT on diagnosis, prognosis, POC, HEP, and relevant tissues and  anatomy.                                                                                                                                     PATIENT EDUCATION:  Education details: HEP, prognosis, relevant tissues and anatomy Person educated: Patient Education method: Explanation, Demonstration, Tactile cues, Verbal cues, and Handouts Education comprehension: verbalized understanding, returned demonstration, verbal cues required, tactile cues required, and needs further education  HOME EXERCISE PROGRAM: Access Code: [email or web address] URL: [email or web address] Date: 07/09/2024 Prepared by: Washington Scot  Exercises - Supine Chin Tuck  - 2 x daily - 5 x weekly - 2-3 sets - 6 reps - 3 hold - Supine Cervical Rotation AROM on Pillow  - 2 x daily - 5 x weekly - 2-3 sets - 6 reps - 3 hold - Seated Shoulder Shrug  - 2 x daily - 5 x weekly - 2-3 sets - 6 reps - 3 hold  ASSESSMENT:  CLINICAL IMPRESSION: Patient is a 63 y.o. female who was seen today for physical therapy evaluation and treatment for left cervical radiculopathy. Current deficits are debilitating pain triggered with cervical AROM, left UE ROM, and shoulder  shrugging. Cervical ROM noted to be extremely limited with highly irritable pain/nx and tingling spreading down into L arm. Pt would benefit from skilled PT to address said impairments via plan down below.   OBJECTIVE IMPAIRMENTS: decreased ROM, impaired sensation, impaired UE functional use, postural dysfunction, and pain.    REHAB POTENTIAL: Fair    CLINICAL DECISION MAKING: Evolving/moderate complexity  EVALUATION COMPLEXITY: Moderate   GOALS: Goals reviewed with patient? No  SHORT TERM GOALS: Target date: 07/30/2024    Patient will decrease worse pain to 5/10 at most to improve ADL completion and overall QOL  Baseline: 7/10 Goal status: INITIAL  2.  Patient will be able to cook at least 60% capacity to demonstrate improvements in functional UE use  ADL's. Baseline: <50% Goal status: INITIAL   LONG TERM GOALS: Target date: 09/08/2024  Patient will demonstrate a 2 point improvement in NDI to show improvements in ADL completion and overall QOL   Baseline: 6 Goal status: INITIAL  2.  Patient will demonstrate ability to lift 10# overhead to demonstrate functional strength needed for cooking/ADLs  Baseline: 0% Goal status: INITIAL  3. Patient will decrease worst pain to 4/10 at most to improve ADL completion and overall QOL  Baseline: 7/10 Goal status: INITIAL  4. Patient will have at least [facility] ROM of Cervical spine to demonstrate improved joint mobility needed for ADL completion  Baseline: 0%  Goal status: initial       PLAN:    PT FREQUENCY: 1-2x/week  PT DURATION: 8 weeks  PLANNED INTERVENTIONS: 97110-Therapeutic exercises, 97530- Therapeutic activity, 97112- Neuromuscular re-education, 97535- Self Care, 02859- Manual therapy, and Patient/Family education  PLAN FOR NEXT SESSION: HEP assessment and progression, symptom modulation, and loading (isolated/functional). Manual therapy and NME training as needed.     Washington Greener Draysen Weygandt  PT, DTP  For all possible CPT codes, reference the Planned Interventions line above.     Check all conditions that are expected to impact treatment: {Conditions expected to impact treatment:Musculoskeletal disorders   If treatment provided at initial evaluation, no treatment charged due to lack of authorization.

## 2024-08-11 ENCOUNTER — Other Ambulatory Visit: Payer: Self-pay

## 2024-08-11 ENCOUNTER — Emergency Department (HOSPITAL_COMMUNITY)

## 2024-08-11 ENCOUNTER — Emergency Department (HOSPITAL_COMMUNITY)
Admission: EM | Admit: 2024-08-11 | Discharge: 2024-08-11 | Disposition: A | Discharge status: Home / Self Care | Attending: Emergency Medicine | Admitting: Emergency Medicine

## 2024-08-11 ENCOUNTER — Encounter (HOSPITAL_COMMUNITY): Payer: Self-pay

## 2024-08-11 DIAGNOSIS — S39012A Strain of muscle, fascia and tendon of lower back, initial encounter: Secondary | ICD-10-CM | POA: Insufficient documentation

## 2024-08-11 DIAGNOSIS — Y92410 Unspecified street and highway as the place of occurrence of the external cause: Secondary | ICD-10-CM | POA: Diagnosis not present

## 2024-08-11 DIAGNOSIS — M545 Low back pain, unspecified: Secondary | ICD-10-CM | POA: Diagnosis present

## 2024-08-11 LAB — BASIC METABOLIC PANEL WITH GFR
Anion gap: 13 (ref 5–15)
BUN: 7 mg/dL — ABNORMAL LOW (ref 8–23)
CO2: 21 mmol/L — ABNORMAL LOW (ref 22–32)
Calcium: 9.2 mg/dL (ref 8.9–10.3)
Chloride: 102 mmol/L (ref 98–111)
Creatinine, Ser: 0.82 mg/dL (ref 0.44–1.00)
GFR, Estimated: >60 mL/min (ref >60)
Glucose, Bld: 112 mg/dL — ABNORMAL HIGH (ref 70–99)
Potassium: 4.3 mmol/L (ref 3.5–5.1)
Sodium: 136 mmol/L (ref 135–145)

## 2024-08-11 LAB — CBC WITH DIFFERENTIAL/PLATELET
Abs Immature Granulocytes: 0.05 K/uL (ref 0.00–0.07)
Basophils Absolute: 0.1 K/uL (ref 0.0–0.1)
Basophils Relative: 1 %
Eosinophils Absolute: 0.1 K/uL (ref 0.0–0.5)
Eosinophils Relative: 1 %
HCT: 42.4 % (ref 36.0–46.0)
Hemoglobin: 14.4 g/dL (ref 12.0–15.0)
Immature Granulocytes: 0 %
Lymphocytes Relative: 14 %
Lymphs Abs: 1.6 K/uL (ref 0.7–4.0)
MCH: 29.9 pg (ref 26.0–34.0)
MCHC: 34 g/dL (ref 30.0–36.0)
MCV: 88.1 fL (ref 80.0–100.0)
Monocytes Absolute: 0.8 K/uL (ref 0.1–1.0)
Monocytes Relative: 7 %
Neutro Abs: 8.9 K/uL — ABNORMAL HIGH (ref 1.7–7.7)
Neutrophils Relative %: 77 %
Platelets: 396 K/uL (ref 150–400)
RBC: 4.81 MIL/uL (ref 3.87–5.11)
RDW: 12.3 % (ref 11.5–15.5)
WBC: 11.5 K/uL — ABNORMAL HIGH (ref 4.0–10.5)
nRBC: 0 % (ref 0.0–0.2)

## 2024-08-11 LAB — I-STAT CHEM 8, ED
BUN: 7 mg/dL — ABNORMAL LOW (ref 8–23)
Calcium, Ion: 1.12 mmol/L — ABNORMAL LOW (ref 1.15–1.40)
Chloride: 107 mmol/L (ref 98–111)
Creatinine, Ser: 0.9 mg/dL (ref 0.44–1.00)
Glucose, Bld: 109 mg/dL — ABNORMAL HIGH (ref 70–99)
HCT: 40 % (ref 36.0–46.0)
Hemoglobin: 13.6 g/dL (ref 12.0–15.0)
Potassium: 4.6 mmol/L (ref 3.5–5.1)
Sodium: 139 mmol/L (ref 135–145)
TCO2: 25 mmol/L (ref 22–32)

## 2024-08-11 MED ORDER — METHOCARBAMOL 500 MG PO TABS
500.0000 mg | ORAL_TABLET | Freq: Once | ORAL | Status: AC
  Administered 2024-08-11: 500 mg via ORAL
  Filled 2024-08-11: qty 1

## 2024-08-11 MED ORDER — IOHEXOL 350 MG/ML SOLN
75.0000 mL | Freq: Once | INTRAVENOUS | Status: AC | PRN
  Administered 2024-08-11: 75 mL via INTRAVENOUS

## 2024-08-11 MED ORDER — KETOROLAC TROMETHAMINE 15 MG/ML IJ SOLN
15.0000 mg | Freq: Once | INTRAMUSCULAR | Status: AC
  Administered 2024-08-11: 15 mg via INTRAVENOUS
  Filled 2024-08-11: qty 1

## 2024-08-11 MED ORDER — METHOCARBAMOL 500 MG PO TABS: 500.0000 mg | ORAL_TABLET | Freq: Two times a day (BID) | ORAL | 0 refills | Status: AC
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
## 2024-08-11 NOTE — ED Notes (Signed)
 Patient transported to CT
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
## 2024-10-07 ENCOUNTER — Ambulatory Visit: Payer: Self-pay | Admitting: Family Medicine

## 2024-10-08 ENCOUNTER — Encounter: Payer: Self-pay | Admitting: Family Medicine
